# Patient Record
Sex: Female | Born: 1995 | Race: White | Hispanic: No | Smoking: Never smoker
Health system: Southern US, Community
[De-identification: ages and names within clinical notes are randomized; demographics above are authoritative.]

## PROBLEM LIST (undated history)

## (undated) DIAGNOSIS — Q5128 Other doubling of uterus, other specified: Secondary | ICD-10-CM

## (undated) DIAGNOSIS — J45909 Unspecified asthma, uncomplicated: Secondary | ICD-10-CM

## (undated) DIAGNOSIS — O34599 Maternal care for other abnormalities of gravid uterus, unspecified trimester: Secondary | ICD-10-CM

## (undated) HISTORY — PX: NO PAST SURGERIES: SHX2092

## (undated) HISTORY — PX: VAGINAL SEPTOPLASTY: SHX5390

---
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
## 2017-09-09 ENCOUNTER — Other Ambulatory Visit (HOSPITAL_COMMUNITY): Payer: Self-pay | Admitting: Obstetrics and Gynecology

## 2017-09-09 DIAGNOSIS — Z3689 Encounter for other specified antenatal screening: Secondary | ICD-10-CM

## 2017-09-09 DIAGNOSIS — Z3A21 21 weeks gestation of pregnancy: Secondary | ICD-10-CM

## 2017-09-09 DIAGNOSIS — Q6689 Other  specified congenital deformities of feet: Secondary | ICD-10-CM

## 2017-09-09 DIAGNOSIS — Q6602 Congenital talipes equinovarus, left foot: Secondary | ICD-10-CM

## 2017-09-16 ENCOUNTER — Encounter (HOSPITAL_COMMUNITY): Payer: Self-pay | Admitting: *Deleted

## 2017-09-17 ENCOUNTER — Other Ambulatory Visit (HOSPITAL_COMMUNITY): Payer: Self-pay | Admitting: Obstetrics and Gynecology

## 2017-09-17 ENCOUNTER — Encounter (HOSPITAL_COMMUNITY): Payer: Self-pay

## 2017-09-17 ENCOUNTER — Ambulatory Visit (HOSPITAL_COMMUNITY)
Admission: RE | Admit: 2017-09-17 | Discharge: 2017-09-17 | Disposition: A | Discharge status: Home / Self Care | Payer: Medicaid Other | Source: Ambulatory Visit | Attending: Obstetrics and Gynecology | Admitting: Obstetrics and Gynecology

## 2017-09-17 DIAGNOSIS — Z363 Encounter for antenatal screening for malformations: Secondary | ICD-10-CM | POA: Diagnosis present

## 2017-09-17 DIAGNOSIS — Z3A20 20 weeks gestation of pregnancy: Secondary | ICD-10-CM | POA: Insufficient documentation

## 2017-09-17 DIAGNOSIS — Z3A21 21 weeks gestation of pregnancy: Secondary | ICD-10-CM

## 2017-09-17 DIAGNOSIS — Z3689 Encounter for other specified antenatal screening: Secondary | ICD-10-CM

## 2017-09-17 DIAGNOSIS — Q6689 Other  specified congenital deformities of feet: Secondary | ICD-10-CM | POA: Diagnosis not present

## 2017-09-17 DIAGNOSIS — O34592 Maternal care for other abnormalities of gravid uterus, second trimester: Secondary | ICD-10-CM | POA: Insufficient documentation

## 2017-09-17 DIAGNOSIS — O358XX0 Maternal care for other (suspected) fetal abnormality and damage, not applicable or unspecified: Secondary | ICD-10-CM | POA: Insufficient documentation

## 2017-09-17 DIAGNOSIS — O359XX0 Maternal care for (suspected) fetal abnormality and damage, unspecified, not applicable or unspecified: Secondary | ICD-10-CM

## 2017-09-17 DIAGNOSIS — Q6602 Congenital talipes equinovarus, left foot: Secondary | ICD-10-CM

## 2017-09-17 HISTORY — DX: Unspecified asthma, uncomplicated: J45.909

## 2017-09-17 HISTORY — DX: Maternal care for other abnormalities of gravid uterus, unspecified trimester: O34.599

## 2017-09-17 HISTORY — DX: Other and unspecified doubling of uterus: Q51.28

## 2017-09-17 NOTE — Consult Note (Signed)
Maternal Fetal Medicine Consultation  I had the pleasure of seeing your patient Anne Lee for Maternal-Fetal Medicine consultation on 09/17/2017. As you know, Anne Lee is a 22 y.o. 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 Pregnancy was noted to be in the right horn.  Pelvic exam on 4/31/19 revealed "two vaginas" and plan for cesarean delivery was discussed. A first trimester screen was low risk for aneuploidy. At the time of anatomy ultrasound on 09/09/17 a left club foot was seen. The anatomic survey was incomplete but no other anomalies were noted.   Anne Lee feels well today. She denies contractions, loss of fluid, or vaginal bleeding. Fetal movement is active.   Prior to her visit today Anne Lee had an obstetric ultrasound that showed a female fetus with a left club foot. The remainder of the detailed anatomic survey was complete with no other anomalies noted. The pregnancy was seen in the right uterine horn. See separate ultrasound report for full detals.  The remainder of Anne Lee's medical history is notable for asthma. Her past surgical history is significant for no surgeries. She takes prenatal vitamins and is allergic to no medications. Anne Lee denies alcohol, tobacco or other drug use. Her family history is noncontributory.  We discussed the following issues during her visit today:  1. Isolated unilateral club foot: We discussed that isolated club foot is a common congenital abnormality that is generally sporadic. Corrective treatment has excellent outcomes. Anne Lee was referred for pediatric orthopedic consultation in the third trimester.   2. Uterine didelphys: Uterine anomalies occur in about 0.5-5.5% of women and are typically sporadic abnormalities in the normal fusion, elongation, canalization or resorption process that results in the formation of the  uterus, cervix and kidneys. Uterine didelphys results from a defect of lateral fusion of the two Mullerian ducts resulting in two uterine horns and often a duplicated cervix. Anne Lee was counseled that although one of the more dramatic appearing of the Mullerian anomalies, uterine didelphys is associated with reasonable pregnancy outcomes. Several pregnancy complications are more common however, including miscarriage (25-69%), preterm birth (17-53%), fetal growth restriction (11%), preeclampsia (13%), stillbirth (up to 5%), malpresentation (23-38%), labor dystocia and postpartum hemorrhage. Cesarean delivery rates are particularly high in uterine didelphys (up to 80% in some studies). Cesarean delivery is often more complicated because of distorted uterine anatomy, with a greater incidence of needing a classical hysterotomy. Women with uterine anomalies are also at higher risk for renal anomalies. Therefore, I recommend obtaining a one-time renal ultrasound in all women with uterine anomalies. Anne Lee understands that there are no interventions to prevent these complications and thus management typically involves clinical vigilance and surveillance strategies. I recommend serial growth ultrasound assessments starting at 28 weeks.  Thank you for the opportunity to be a part of the care of Anne Lee. Please contact our office if we can be of further assistance.   I spent approximately 40 minutes with this patient with over 50% of time spent in face-to-face counseling.  Darlyn ReadEmily Ramia Sidney, MD Maternal-Fetal Medicine

## 2018-04-28 ENCOUNTER — Encounter (HOSPITAL_COMMUNITY): Payer: Self-pay

## 2018-08-19 ENCOUNTER — Telehealth: Payer: Self-pay | Admitting: *Deleted

## 2018-08-19 NOTE — Telephone Encounter (Signed)
Pt aware to be looking for our phone call Monday. Will call as close to appt time as possible. Pt voiced understanding. JSY

## 2018-08-22 ENCOUNTER — Encounter: Payer: Self-pay | Admitting: Adult Health

## 2018-08-24 ENCOUNTER — Other Ambulatory Visit: Payer: Self-pay

## 2018-08-24 ENCOUNTER — Encounter: Payer: Self-pay | Admitting: Adult Health

## 2019-03-01 ENCOUNTER — Encounter: Payer: Self-pay | Admitting: Adult Health

## 2019-04-14 HISTORY — PX: DILATION AND CURETTAGE OF UTERUS: SHX78

## 2019-04-26 ENCOUNTER — Telehealth: Payer: Self-pay

## 2019-04-26 NOTE — Telephone Encounter (Addendum)
VM left on nurse line asking if our office is accepting new patients.   Called pt. Pt states she is [redacted] weeks pregnant and would like to begin care at our office. Pt states she had one prenatal appt around 8 weeks at an OBGYN near to [facility]; she believes it is a Development worker, international aid. Pt states she began bleeding yesterday morning like a period. She reports the bleeding stopped and began again this AM. Pt endorses continued bleeding like a period and pain in her abdomen.   I explained to the pt that it is very important for her to go to the ED at [facility] for evaluation. Explained that we are accepting new patients and she should call us back after she has had further evaluation and her acute symptoms have stabilized. Pt states she was told she had bleeding behind her cervix earlier in pregnancy so she is not sure this is abnormal. Educated pt that bleeding like a period is not a normal symptom of pregnancy and she should be evaluated immediately. Pt expresses intent to go to the [facility] ED for further evaluation.

## 2020-02-28 IMAGING — US US MFM OB DETAIL+14 WK
1 series · 14 of 28 positions shown · non-contrast
Comparison: none

[Series 1: us mfm ob detail+14 wk · 83 acquisitions, 14 frames shown]
[im 4/83]
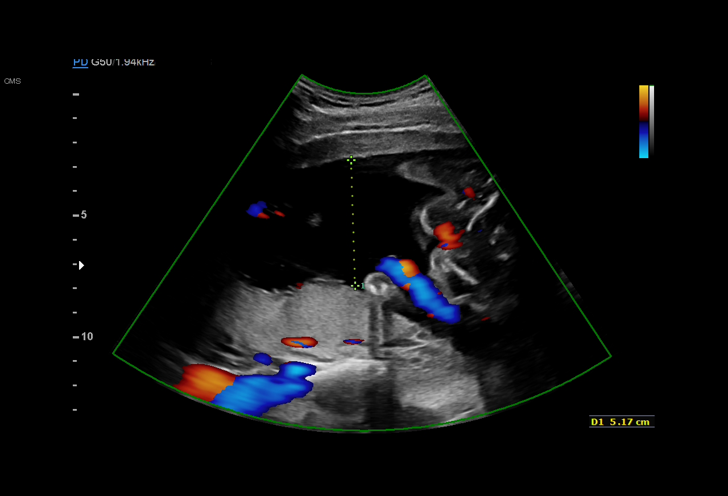
[im 10/83]
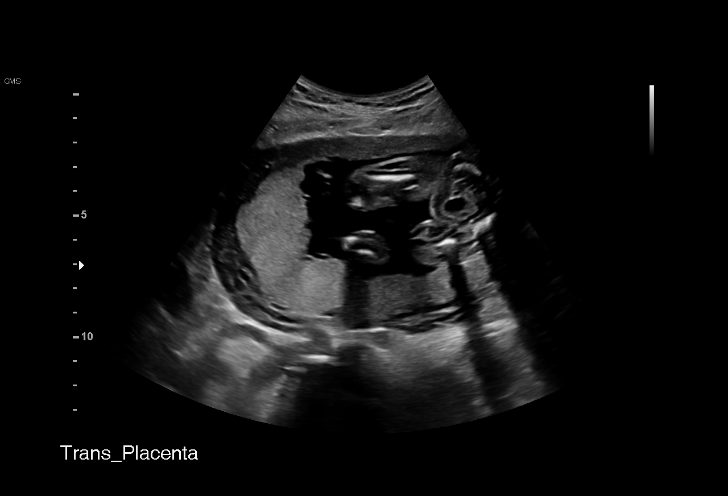
[im 16/83]
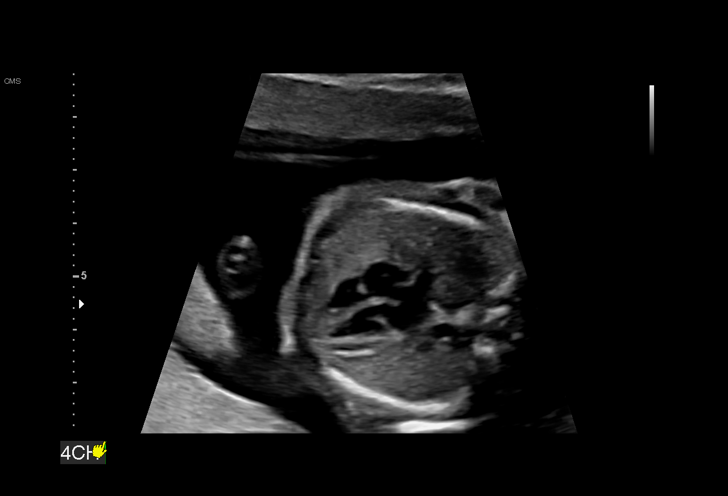
[im 22/83]
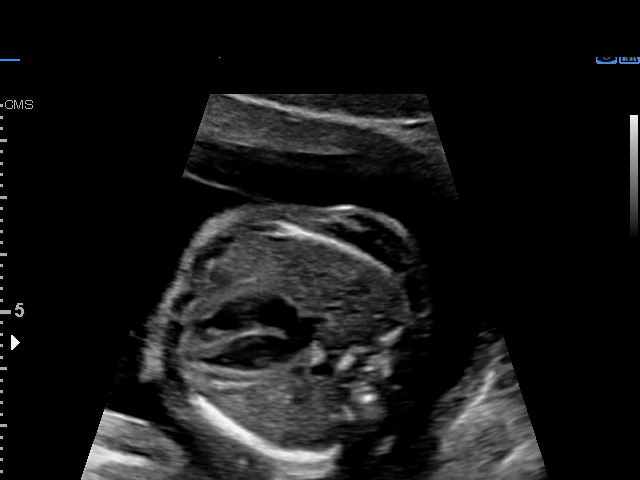
[im 28/83]
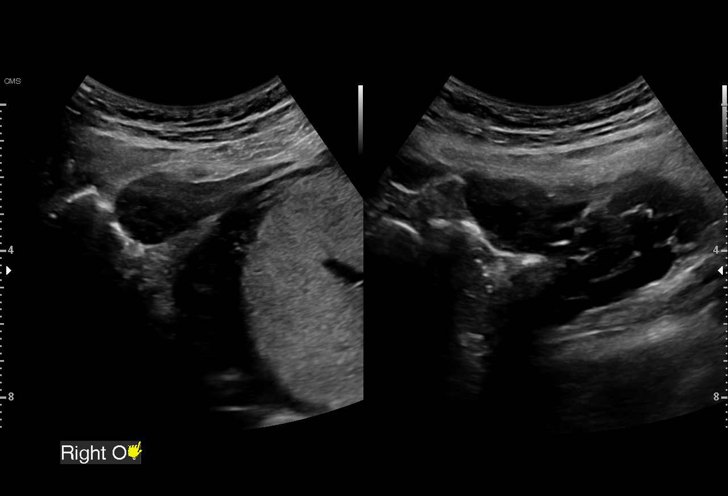
[im 34/83]
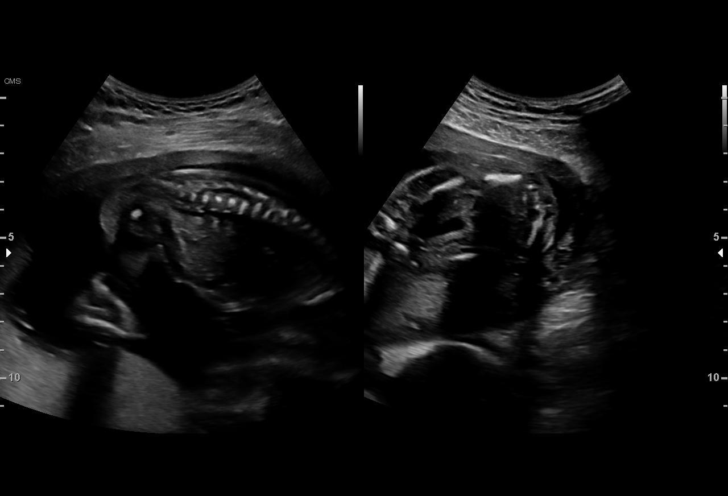
[im 40/83]
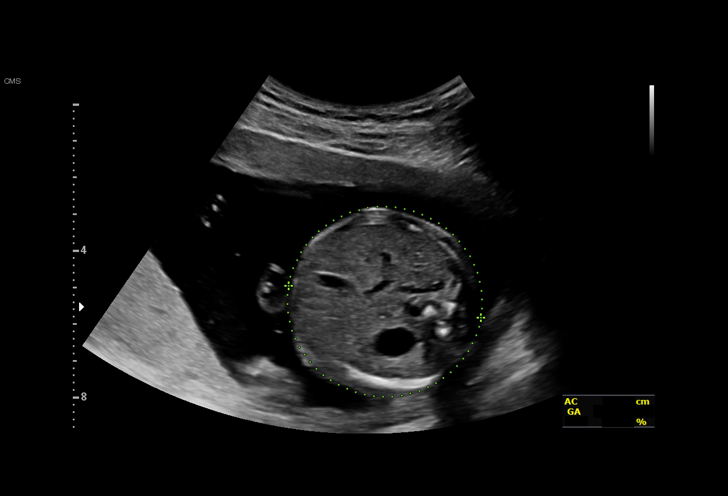
[im 46/83]
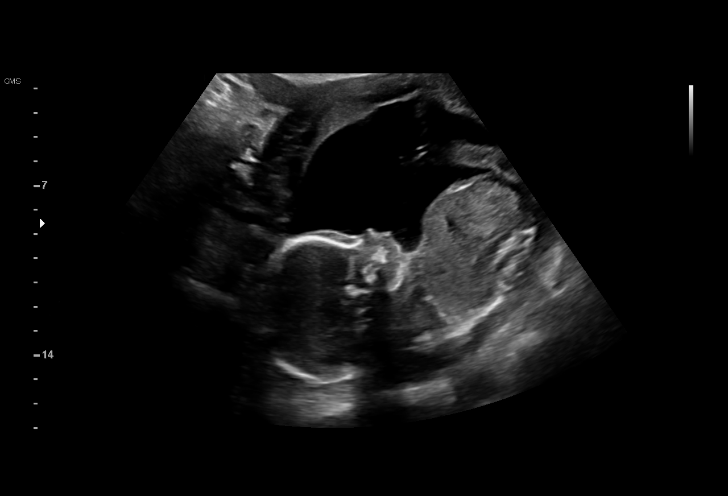
[im 52/83]
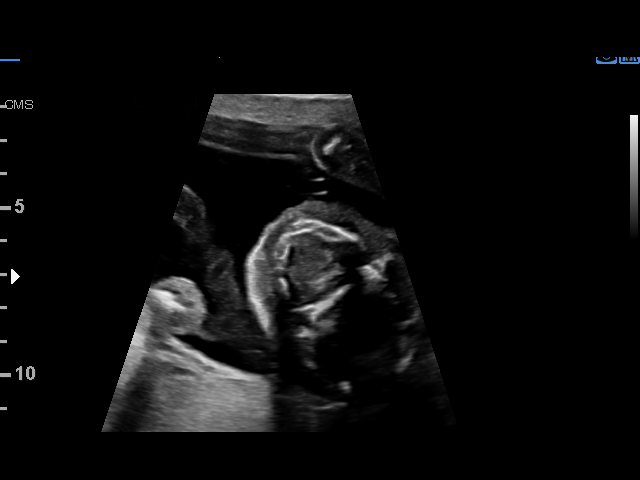
[im 58/83]
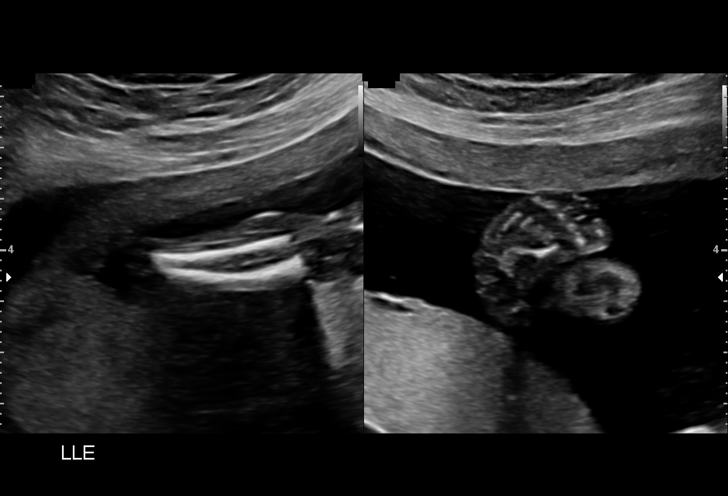
[im 64/83]
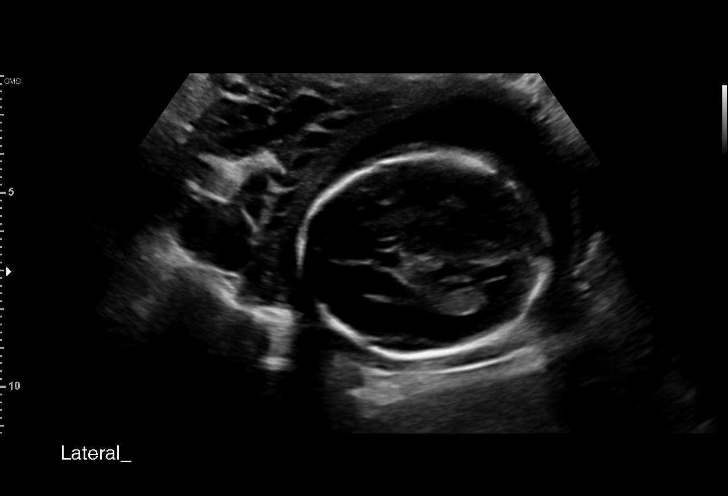
[im 70/83]
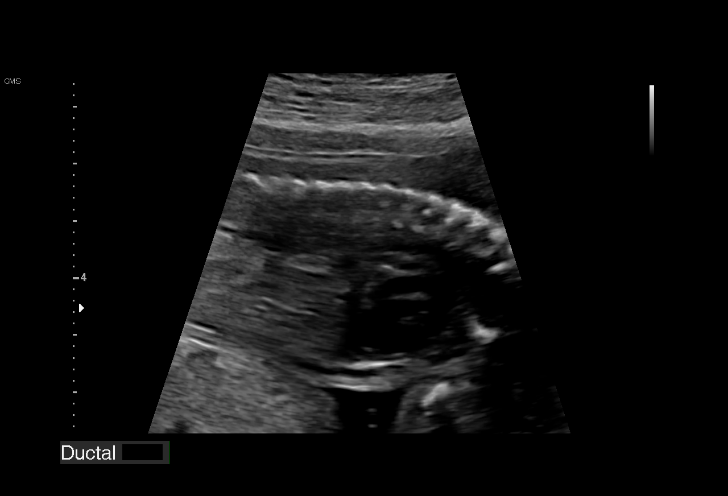
[im 76/83]
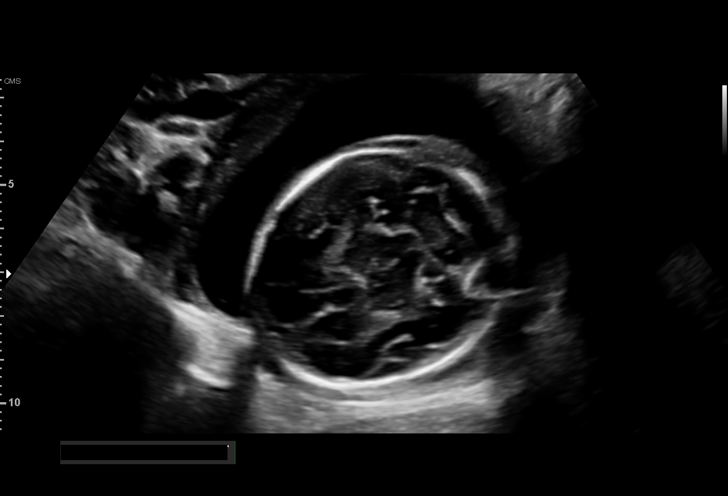
[im 83/83]
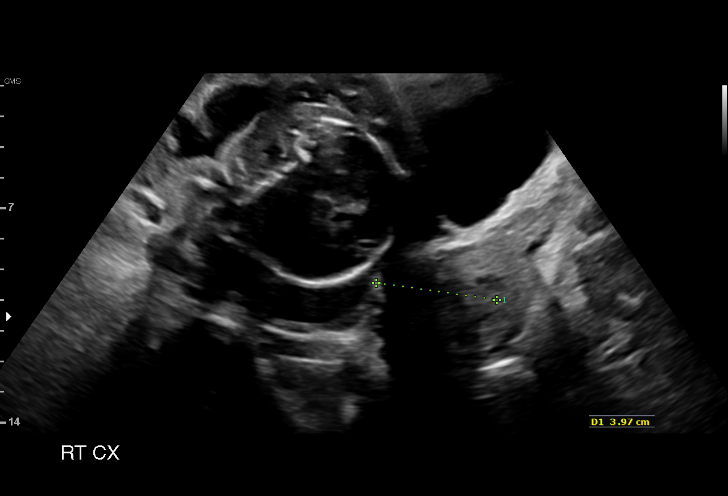

[14 of 28 positions shown; findings below may reference images not displayed]

OB/GYN [address]
[HOSPITAL], [place]
AUSBERT DO

1  MAHEEN CLIMACO         [id number]      [id number]     [id number]
Indications

20 weeks gestation of pregnancy
Encounter for antenatal screening for
malformations
Uterine abnormality during pregnancy
(didelphys)
Fetal abnormality - other known or
suspected (left club foot)
OB History

Gravidity:    1         Term:   0        Prem:   0        SAB:   0
TOP:          0       Ectopic:  0        Living: 0
Fetal Evaluation

Num Of Fetuses:     1
Fetal Heart         159
Rate(bpm):
Cardiac Activity:   Observed
Presentation:       Cephalic
Placenta:           Posterior, above cervical os
P. Cord Insertion:  Visualized

Amniotic Fluid
AFI FV:      Subjectively within normal limits

Largest Pocket(cm)
5.17
Biometry
BPD:      50.9  mm     G. Age:  21w 3d         91  %    CI:        73.06   %    70 - 86
FL/HC:      15.8   %    16.8 -
HC:      189.3  mm     G. Age:  21w 2d         85  %    HC/AC:      1.16        1.09 -
AC:      163.3  mm     G. Age:  21w 3d         81  %    FL/BPD:     58.9   %
FL:         30  mm     G. Age:  19w 2d         16  %    FL/AC:      18.4   %    20 - 24
HUM:      30.3  mm     G. Age:  20w 0d         49  %

Est. FW:     360  gm    0 lb 13 oz      54  %
Gestational Age

LMP:           20w 1d        Date:  04/29/17                 EDD:   02/03/18
U/S Today:     20w 6d                                        EDD:   01/29/18
Best:          20w 1d     Det. By:  LMP  (04/29/17)          EDD:   02/03/18
Anatomy

Cranium:               Appears normal         Aortic Arch:            Appears normal
Cavum:                 Appears normal         Ductal Arch:            Appears normal
Ventricles:            Appears normal         Diaphragm:              Appears normal
Choroid Plexus:        Appears normal         Stomach:                Appears normal, left
sided
Cerebellum:            Appears normal         Abdomen:                Appears normal
Posterior Fossa:       Appears normal         Abdominal Wall:         Appears nml (cord
insert, abd wall)
Nuchal Fold:           Appears normal         Cord Vessels:           Appears normal (3
vessel cord)
Face:                  Appears normal         Kidneys:                Appear normal
(orbits and profile)
Lips:                  Appears normal         Bladder:                Appears normal
Thoracic:              Appears normal         Spine:                  Appears normal
Heart:                 Appears normal         Upper Extremities:      Appears normal
(4CH, axis, and
situs)
RVOT:                  Appears normal         Lower Extremities:      Left Clubfoot
LVOT:                  Appears normal

Other:  Fetus appears to be a male. Nasal bone visualized. Technically
difficult due to fetal position.
Cervix Uterus Adnexa

Cervix
Length:              4  cm.
Normal appearance by transabdominal scan.

Uterus
Uterus didelphys.

Left Ovary
Not visualized.

Right Ovary
Within normal limits.

Cul De Sac:   No free fluid seen.

Adnexa:       No abnormality visualized.
Impression

Single living intrauterine pregnancy at 20w 1d.
Known uterus didelplhys, pregnancy in right horn.
Cephalic presentation.
Placenta Posterior, above cervical os.
Appropriate fetal growth.
Normal amniotic fluid volume.
The fetal anatomic survey is complete.
Left club foot.
Otherwise normal fetal anatomy.
No additional fetal anomalies or soft markers of aneuploidy
seen.
The adnexa appear normal bilaterally without masses.
The cervix measures 4cm on transabdominal imaging without
funneling.
Recommendations

See MFM consult.

## 2020-10-04 ENCOUNTER — Encounter: Payer: Self-pay | Admitting: Obstetrics & Gynecology

## 2020-10-04 ENCOUNTER — Ambulatory Visit (INDEPENDENT_AMBULATORY_CARE_PROVIDER_SITE_OTHER): Payer: Medicaid Other | Admitting: Obstetrics & Gynecology

## 2020-10-04 ENCOUNTER — Other Ambulatory Visit: Payer: Self-pay

## 2020-10-04 VITALS — BP 115/75 | HR 70 | Ht 62.0 in | Wt 118.4 lb

## 2020-10-04 DIAGNOSIS — Q5128 Other doubling of uterus, other specified: Secondary | ICD-10-CM | POA: Diagnosis not present

## 2020-10-04 DIAGNOSIS — N898 Other specified noninflammatory disorders of vagina: Secondary | ICD-10-CM

## 2020-10-04 NOTE — Progress Notes (Signed)
   GYN VISIT Patient name: Anne Lee MRN [id number]  Date of birth: [birth year] Chief Complaint:   Gynecologic Exam  History of Present Illness:   Anne Lee is a 25 y.o. [phone] female being seen today for the following concerns  Vaginal irritation: 3/22- s/p vaginal septoplasty.  [place] up [place]  She is concerned that a stitch is still present.  Denies discharge, itching or irritation.  Sexually active, previously painful. She does note some irregular spotting- Nexplanon placed about 4 mos ago  Contraception: Nexplanon placed 2022  Depression screen PHQ 2/9 10/04/2020  Decreased Interest 1  Down, Depressed, Hopeless 1  PHQ - 2 Score 2  Altered sleeping 1  Tired, decreased energy 1  Change in appetite 0  Feeling bad or failure about yourself  0  Trouble concentrating 0  Moving slowly or fidgety/restless 1  Suicidal thoughts 0  PHQ-9 Score 5     Review of Systems:   Pertinent items are noted in HPI Denies fever/chills, dizziness, headaches, visual disturbances, fatigue, shortness of breath, chest pain, abdominal pain, vomiting, bowel movements, urination, or intercourse unless otherwise stated above.  Pertinent History Reviewed:  Reviewed past medical,surgical, social, obstetrical and family history.  Reviewed problem list, medications and allergies. Physical Assessment:   Vitals:   10/04/20 1205  BP: 115/75  Pulse: 70  Weight: 118 lb 6.4 oz (53.7 kg)  Height: 5\' 2"  (1.575 m)  Body mass index is 21.66 kg/m.       Physical Examination:   General appearance: alert, well appearing, and in no distress  Psych: mood appropriate, normal affect  Skin: warm & dry   Cardiovascular: normal heart rate noted  Respiratory: normal respiratory effort, no distress  Abdomen: soft, non-tender   Pelvic: normal external genitalia, vulva, vagina- no abnormalities appreciated, no suture palpated, cervices x2  Extremities: no edema   Chaperone:    Assessment & Plan:  1) Vaginal concerns -healing appropriately from surgery, normal appearing vaginal canal, no suture appreciated  2) Contraceptive management -continue Nexplanon, reassured pt that irregular bleeding is common side effect  []  plan to obtain records, if pap not up to date, will complete at next visit   No orders of the defined types were placed in this encounter.   No follow-ups on file.   Faith Rogue, DO Attending Obstetrician & Gynecologist, [facility] for Myna Hidalgo, [facility]

## 2020-10-08 ENCOUNTER — Encounter: Payer: Self-pay | Admitting: Nurse Practitioner

## 2020-10-08 ENCOUNTER — Ambulatory Visit (INDEPENDENT_AMBULATORY_CARE_PROVIDER_SITE_OTHER): Payer: Medicaid Other | Admitting: Nurse Practitioner

## 2020-10-08 ENCOUNTER — Other Ambulatory Visit: Payer: Self-pay

## 2020-10-08 VITALS — BP 102/67 | HR 86 | Temp 97.9°F | Ht 62.0 in | Wt 119.0 lb

## 2020-10-08 DIAGNOSIS — F418 Other specified anxiety disorders: Secondary | ICD-10-CM | POA: Diagnosis not present

## 2020-10-08 MED ORDER — ESCITALOPRAM OXALATE 5 MG PO TABS: 5.0000 mg | ORAL_TABLET | Freq: Every day | ORAL | 0 refills | Status: DC

## 2020-10-08 NOTE — Progress Notes (Signed)
Denies suspicion for  New Patient Note  RE: Anne Lee MRN: [id number] DOB: 06/19/1995 Date of Office Visit: 10/08/2020  Chief Complaint: New Patient (Initial Visit), Anxiety, and Depression  History of Present Illness:   Depression: Patient complains of depression. She complains of depressed mood and difficulty concentrating. Onset was approximately a few months ago, unchanged since that time.  She denies current suicidal and homicidal plan or intent.   Family history significant for no psychiatric illness.Possible organic causes contributing are: none.  Risk factors: negative life event multiple deaths in the family in the past year, and a loss of a child. (Grief) Previous treatment includes  nothing  and none. She complains of the following side effects from the treatment: none.   Flowsheet Row Office Visit from 10/08/2020 in [facility]  PHQ-9 Total Score 11      Anxiety: Patient complains of anxiety disorder.  She has the following symptoms: difficulty concentrating, irritable. Onset of symptoms was approximately a few months ago, unchanged since that time. She denies current suicidal and homicidal ideation. Family history significant for no psychiatric illness.Possible organic causes contributing are: none. Risk factors: negative life event death in family.  Previous treatment includes  nothing  and none.  She complains of the following side effects from the treatment: none.   GAD 7 : Generalized Anxiety Score 10/08/2020 10/04/2020  Nervous, Anxious, on Edge 1 1  Control/stop worrying 1 1  Worry too much - different things 1 1  Trouble relaxing 1 1  Restless 1 1  Easily annoyed or irritable 1 1  Afraid - awful might happen 1 1  Total GAD 7 Score 7 7  Anxiety Difficulty Somewhat difficult -     Assessment and Plan: Lei is a 25 y.o. female with: Anxiety associated with depression Provided education to patient printed handouts given on managing depression  and anxiety.  This is new for patient in the last few months.  Symptoms associated with multiple deaths in the family.  Grief counseling resources provided to patient.  Started patient on Lexapro 5 mg tablet by mouth daily.  Follow-up in 6 weeks.  Rx sent to pharmacy.  Return in about 6 weeks (around 11/19/2020).   Diagnostics:   Past Medical History: Patient Active Problem List   Diagnosis Date Noted   Anxiety associated with depression 10/08/2020   Past Medical History:  Diagnosis Date   Asthma    Uterus didelphys in pregnancy    Past Surgical History: Past Surgical History:  Procedure Laterality Date   CESAREAN SECTION     VAGINAL SEPTOPLASTY     Medication List:  Current Outpatient Medications  Medication Sig Dispense Refill   escitalopram (LEXAPRO) 5 MG tablet Take 1 tablet (5 mg total) by mouth daily. 60 tablet 0   etonogestrel (NEXPLANON) 68 MG IMPL implant 1 each by Subdermal route once.     No current facility-administered medications for this visit.   Allergies: No Known Allergies Social History: Social History   Socioeconomic History   Marital status: Single    Spouse name: Not on file   Number of children: Not on file   Years of education: Not on file   Highest education level: Not on file  Occupational History   Not on file  Tobacco Use   Smoking status: Never   Smokeless tobacco: Never  Vaping Use   Vaping Use: Never used  Substance and Sexual Activity   Alcohol use: Never   Drug use:  Never   Sexual activity: Yes    Birth control/protection: Implant  Other Topics Concern   Not on file  Social History Narrative   Not on file   Social Determinants of Health   Financial Resource Strain: Not on file  Food Insecurity: Not on file  Transportation Needs: Not on file  Physical Activity: Not on file  Stress: Not on file  Social Connections: Not on file       Family History: Family History  Problem Relation Age of Onset   Depression  Mother    Anxiety disorder Mother    Kidney disease Mother    Hypertension Mother    Diabetes Mother    Hypertension Father          Review of Systems  Constitutional: Negative.   HENT: Negative.    Eyes: Negative.   Respiratory: Negative.    Cardiovascular: Negative.   Gastrointestinal: Negative.   Musculoskeletal: Negative.   Skin:  Negative for rash.  Psychiatric/Behavioral:  The patient is nervous/anxious.   All other systems reviewed and are negative. Objective: BP 102/67   Pulse 86   Temp 97.9 F (36.6 C) (Temporal)   Ht 5\' 2"  (1.575 m)   Wt 119 lb (54 kg)   SpO2 99%   BMI 21.77 kg/m  Body mass index is 21.77 kg/m. Physical Exam Vitals and nursing note reviewed.  Constitutional:      Appearance: Normal appearance.  HENT:     Head: Normocephalic.     Nose: Nose normal.  Eyes:     Conjunctiva/sclera: Conjunctivae normal.  Cardiovascular:     Rate and Rhythm: Normal rate and regular rhythm.     Pulses: Normal pulses.     Heart sounds: Normal heart sounds.  Pulmonary:     Effort: Pulmonary effort is normal.     Breath sounds: Normal breath sounds.  Abdominal:     General: Bowel sounds are normal.  Skin:    Findings: No rash.  Neurological:     Mental Status: She is alert and oriented to person, place, and time.  Psychiatric:        Attention and Perception: Attention and perception normal.        Mood and Affect: Mood is anxious and depressed.        Speech: Speech normal.        Behavior: Behavior is cooperative.        Thought Content: Thought content does not include suicidal ideation.   The plan was reviewed with the patient/family, and all questions/concerned were addressed.  It was my pleasure to see [name] today and participate in her care. Please feel free to contact me with any questions or concerns.  Sincerely,  Aurther Loft NP [facility]

## 2020-10-08 NOTE — Assessment & Plan Note (Signed)
Provided education to patient printed handouts given on managing depression and anxiety.  This is new for patient in the last few months.  Symptoms associated with multiple deaths in the family.  Grief counseling resources provided to patient.  Started patient on Lexapro 5 mg tablet by mouth daily.  Follow-up in 6 weeks.  Rx sent to pharmacy.

## 2020-10-08 NOTE — Patient Instructions (Signed)
http://APA.org/depression-guideline"> https://clinicalkey.com"> http://point-of-care.elsevierperformancemanager.com/skills/"> http://point-of-care.elsevierperformancemanager.com">  Managing Depression, Adult Depression is a mental health condition that affects your thoughts, feelings, and actions. Being diagnosed with depression can bring you relief if you did not know why you have felt or behaved a certain way. It could also leave you feeling overwhelmed with uncertainty about your future. Preparing yourself tomanage your symptoms can help you feel more positive about your future. How to manage lifestyle changes Managing stress  Stress is your body's reaction to life changes and events, both good and bad. Stress can add to your feelings of depression. Learning to manage your stresscan help lessen your feelings of depression. Try some of the following approaches to reducing your stress (stress reduction techniques): Listen to music that you enjoy and that inspires you. Try using a meditation app or take a meditation class. Develop a practice that helps you connect with your spiritual self. Walk in nature, pray, or go to a place of worship. Do some deep breathing. To do this, inhale slowly through your nose. Pause at the top of your inhale for a few seconds and then exhale slowly, letting your muscles relax. Practice yoga to help relax and work your muscles. Choose a stress reduction technique that suits your lifestyle and personality. These techniques take time and practice to develop. Set aside 5-15 minutes a day to do them. Therapists can offer training in these techniques. Other things you can do to manage stress include: Keeping a stress diary. Knowing your limits and saying no when you think something is too much. Paying attention to how you react to certain situations. You may not be able to control everything, but you can change your reaction. Adding humor to your life by watching funny films  or TV shows. Making time for activities that you enjoy and that relax you.  Medicines Medicines, such as antidepressants, are often a part of treatment for depression. Talk with your pharmacist or health care provider about all the medicines, supplements, and herbal products that you take, their possible side effects, and what medicines and other products are safe to take together. Make sure to report any side effects you may have to your health care provider. Relationships Your health care provider may suggest family therapy, couples therapy, orindividual therapy as part of your treatment. How to recognize changes Everyone responds differently to treatment for depression. As you recover from depression, you may start to: Have more interest in doing activities. Feel less hopeless. Have more energy. Overeat less often, or have a better appetite. Have better mental focus. It is important to recognize if your depression is not getting better or is getting worse. The symptoms you had in the beginning may return, such as: Tiredness (fatigue) or low energy. Eating too much or too little. Sleeping too much or too little. Feeling restless, agitated, or hopeless. Trouble focusing or making decisions. Unexplained physical complaints. Feeling irritable, angry, or aggressive. If you or your family members notice these symptoms coming back, let yourhealth care provider know right away. Follow these instructions at home: Activity  Try to get some form of exercise each day, such as walking, biking, swimming, or lifting weights. Practice stress reduction techniques. Engage your mind by taking a class or doing some volunteer work.  Lifestyle Get the right amount and quality of sleep. Cut down on using caffeine, tobacco, alcohol, and other potentially harmful substances. Eat a healthy diet that includes plenty of vegetables, fruits, whole grains, low-fat dairy products, and lean protein. Do not   eat  a lot of foods that are high in solid fats, added sugars, or salt (sodium). General instructions Take over-the-counter and prescription medicines only as told by your health care provider. Keep all follow-up visits as told by your health care provider. This is important. Where to find support Talking to others  Friends and family members can be sources of support and guidance. Talk to trusted friends or family members about your condition. Explain your symptoms to them, and let them know that you are working with a health care provider to treat your depression. Tell friends and family members how they also can behelpful. Finances Find appropriate mental health providers that fit with your financial situation. Talk with your health care provider about options to get reduced prices on your medicines. Where to find more information You can find support in your area from: Anxiety and Depression Association of America (ADAA): www.adaa.org Mental Health America: www.mentalhealthamerica.net National Alliance on Mental Illness: www.nami.org Contact a health care provider if: You stop taking your antidepressant medicines, and you have any of these symptoms: Nausea. Headache. Light-headedness. Chills and body aches. Not being able to sleep (insomnia). You or your friends and family think your depression is getting worse. Get help right away if: You have thoughts of hurting yourself or others. If you ever feel like you may hurt yourself or others, or have thoughts about taking your own life, get help right away. Go to your nearest emergency department or: Call your local emergency services (911 in the [place] Call a suicide crisis helpline, such as the National Suicide Prevention Lifeline at 1-800-273-8255. This is open 24 hours a day in the [place] Text the Crisis Text Line at 741741 (in the [place] Summary If you are diagnosed with depression, preparing yourself to manage your symptoms is a good way  to feel positive about your future. Work with your health care provider on a management plan that includes stress reduction techniques, medicines (if applicable), therapy, and healthy lifestyle habits. Keep talking with your health care provider about how your treatment is working. If you have thoughts about taking your own life, call a suicide crisis helpline or text a crisis text line. This information is not intended to replace advice given to you by your health care provider. Make sure you discuss any questions you have with your healthcare provider. Document Revised: 02/08/2019 Document Reviewed: 02/08/2019 Elsevier Patient Education  2022 Elsevier Inc. http://NIMH.NIH.Gov">  Generalized Anxiety Disorder, Adult Generalized anxiety disorder (GAD) is a mental health condition. Unlike normal worries, anxiety related to GAD is not triggered by a specific event. These worries do not fade or get better with time. GAD interferes with relationships,work, and school. GAD symptoms can vary from mild to severe. People with severe GAD can have intense waves of anxiety with physical symptoms that are similar to panicattacks. What are the causes? The exact cause of GAD is not known, but the following are believed to have an impact: Differences in natural brain chemicals. Genes passed down from parents to children. Differences in the way threats are perceived. Development during childhood. Personality. What increases the risk? The following factors may make you more likely to develop this condition: Being female. Having a family history of anxiety disorders. Being very shy. Experiencing very stressful life events, such as the death of a loved one. Having a very stressful family environment. What are the signs or symptoms? People with GAD often worry excessively about many things in their lives, such as their health   and family. Symptoms may also include: Mental and emotional symptoms: Worrying  excessively about natural disasters. Fear of being late. Difficulty concentrating. Fears that others are judging your performance. Physical symptoms: Fatigue. Headaches, muscle tension, muscle twitches, trembling, or feeling shaky. Feeling like your heart is pounding or beating very fast. Feeling out of breath or like you cannot take a deep breath. Having trouble falling asleep or staying asleep, or experiencing restlessness. Sweating. Nausea, diarrhea, or irritable bowel syndrome (IBS). Behavioral symptoms: Experiencing erratic moods or irritability. Avoidance of new situations. Avoidance of people. Extreme difficulty making decisions. How is this diagnosed? This condition is diagnosed based on your symptoms and medical history. You will also have a physical exam. Your health care provider may perform tests torule out other possible causes of your symptoms. To be diagnosed with GAD, a person must have anxiety that: Is out of his or her control. Affects several different aspects of his or her life, such as work and relationships. Causes distress that makes him or her unable to take part in normal activities. Includes at least three symptoms of GAD, such as restlessness, fatigue, trouble concentrating, irritability, muscle tension, or sleep problems. Before your health care provider can confirm a diagnosis of GAD, these symptoms must be present more days than they are not, and they must last for 6 months orlonger. How is this treated? This condition may be treated with: Medicine. Antidepressant medicine is usually prescribed for long-term daily control. Anti-anxiety medicines may be added in severe cases, especially when panic attacks occur. Talk therapy (psychotherapy). Certain types of talk therapy can be helpful in treating GAD by providing support, education, and guidance. Options include: Cognitive behavioral therapy (CBT). People learn coping skills and self-calming techniques to  ease their physical symptoms. They learn to identify unrealistic thoughts and behaviors and to replace them with more appropriate thoughts and behaviors. Acceptance and commitment therapy (ACT). This treatment teaches people how to be mindful as a way to cope with unwanted thoughts and feelings. Biofeedback. This process trains you to manage your body's response (physiological response) through breathing techniques and relaxation methods. You will work with a therapist while machines are used to monitor your physical symptoms. Stress management techniques. These include yoga, meditation, and exercise. A mental health specialist can help determine which treatment is best for you. Some people see improvement with one type of therapy. However, other peoplerequire a combination of therapies. Follow these instructions at home: Lifestyle Maintain a consistent routine and schedule. Anticipate stressful situations. Create a plan, and allow extra time to work with your plan. Practice stress management or self-calming techniques that you have learned from your therapist or your health care provider. General instructions Take over-the-counter and prescription medicines only as told by your health care provider. Understand that you are likely to have setbacks. Accept this and be kind to yourself as you persist to take better care of yourself. Recognize and accept your accomplishments, even if you judge them as small. Keep all follow-up visits as told by your health care provider. This is important. Contact a health care provider if: Your symptoms do not get better. Your symptoms get worse. You have signs of depression, such as: A persistently sad or irritable mood. Loss of enjoyment in activities that used to bring you joy. Change in weight or eating. Changes in sleeping habits. Avoiding friends or family members. Loss of energy for normal tasks. Feelings of guilt or worthlessness. Get help right away  if: You have serious thoughts   about hurting yourself or others. If you ever feel like you may hurt yourself or others, or have thoughts about taking your own life, get help right away. Go to your nearest emergency department or: Call your local emergency services (911 in the [place] Call a suicide crisis helpline, such as the National Suicide Prevention Lifeline at 1-800-273-8255. This is open 24 hours a day in the [place] Text the Crisis Text Line at 741741 (in the [place] Summary Generalized anxiety disorder (GAD) is a mental health condition that involves worry that is not triggered by a specific event. People with GAD often worry excessively about many things in their lives, such as their health and family. GAD may cause symptoms such as restlessness, trouble concentrating, sleep problems, frequent sweating, nausea, diarrhea, headaches, and trembling or muscle twitching. A mental health specialist can help determine which treatment is best for you. Some people see improvement with one type of therapy. However, other people require a combination of therapies. This information is not intended to replace advice given to you by your health care provider. Make sure you discuss any questions you have with your healthcare provider. Document Revised: 01/18/2019 Document Reviewed: 01/18/2019 Elsevier Patient Education  2022 Elsevier Inc.  

## 2020-11-19 ENCOUNTER — Encounter: Payer: Self-pay | Admitting: Nurse Practitioner

## 2020-11-19 ENCOUNTER — Encounter: Payer: Medicaid Other | Admitting: Nurse Practitioner

## 2020-11-28 ENCOUNTER — Other Ambulatory Visit: Payer: Self-pay | Admitting: *Deleted

## 2020-11-28 DIAGNOSIS — F418 Other specified anxiety disorders: Secondary | ICD-10-CM

## 2020-11-28 MED ORDER — ESCITALOPRAM OXALATE 5 MG PO TABS: 5.0000 mg | ORAL_TABLET | Freq: Every day | ORAL | 0 refills | Status: DC

## 2021-01-08 ENCOUNTER — Ambulatory Visit (INDEPENDENT_AMBULATORY_CARE_PROVIDER_SITE_OTHER): Payer: Medicaid Other | Admitting: Advanced Practice Midwife

## 2021-01-08 ENCOUNTER — Other Ambulatory Visit (HOSPITAL_COMMUNITY)
Admission: RE | Admit: 2021-01-08 | Discharge: 2021-01-08 | Disposition: A | Discharge status: Home / Self Care | Payer: Medicaid Other | Source: Ambulatory Visit | Attending: Advanced Practice Midwife | Admitting: Advanced Practice Midwife

## 2021-01-08 ENCOUNTER — Encounter: Payer: Self-pay | Admitting: Advanced Practice Midwife

## 2021-01-08 ENCOUNTER — Other Ambulatory Visit: Payer: Self-pay

## 2021-01-08 VITALS — BP 106/71 | HR 86 | Ht 62.0 in | Wt 121.0 lb

## 2021-01-08 DIAGNOSIS — Z124 Encounter for screening for malignant neoplasm of cervix: Secondary | ICD-10-CM | POA: Insufficient documentation

## 2021-01-08 DIAGNOSIS — F419 Anxiety disorder, unspecified: Secondary | ICD-10-CM

## 2021-01-08 DIAGNOSIS — N939 Abnormal uterine and vaginal bleeding, unspecified: Secondary | ICD-10-CM

## 2021-01-08 DIAGNOSIS — Z975 Presence of (intrauterine) contraceptive device: Secondary | ICD-10-CM | POA: Diagnosis not present

## 2021-01-08 DIAGNOSIS — Z3202 Encounter for pregnancy test, result negative: Secondary | ICD-10-CM | POA: Diagnosis not present

## 2021-01-08 DIAGNOSIS — N921 Excessive and frequent menstruation with irregular cycle: Secondary | ICD-10-CM | POA: Diagnosis not present

## 2021-01-08 DIAGNOSIS — F32A Depression, unspecified: Secondary | ICD-10-CM

## 2021-01-08 LAB — POCT URINE PREGNANCY: Preg Test, Ur: NEGATIVE

## 2021-01-08 MED ORDER — MEGESTROL ACETATE 40 MG PO TABS: 40.0000 mg | ORAL_TABLET | Freq: Every day | ORAL | 3 refills | Status: DC

## 2021-01-08 NOTE — Addendum Note (Signed)
Addended by: Federico Flake A on: 01/08/2021 12:10 PM   Modules accepted: Orders

## 2021-01-08 NOTE — Progress Notes (Signed)
   GYN VISIT Patient name: Anne Lee MRN [id number]  Date of birth: February 06, 1996 Chief Complaint:   abnormal bleeding  History of Present Illness:   Anne Lee is a 25 y.o. G16P1011 Caucasian female being seen today for prolonged bleeding on Nexplanon (9/7-9/25).    Patient's last menstrual period was 12/18/2020. The current method of family planning is Nexplanon.  Last pap never.   Depression screen [facility] 2/9 10/08/2020 10/04/2020  Decreased Interest 1 1  Down, Depressed, Hopeless 1 1  PHQ - 2 Score 2 2  Altered sleeping 3 1  Tired, decreased energy 1 1  Change in appetite 1 0  Feeling bad or failure about yourself  1 0  Trouble concentrating 1 0  Moving slowly or fidgety/restless 1 1  Suicidal thoughts 1 0  PHQ-9 Score 11 5  Difficult doing work/chores Somewhat difficult -     GAD 7 : Generalized Anxiety Score 10/08/2020 10/04/2020  Nervous, Anxious, on Edge 1 1  Control/stop worrying 1 1  Worry too much - different things 1 1  Trouble relaxing 1 1  Restless 1 1  Easily annoyed or irritable 1 1  Afraid - awful might happen 1 1  Total GAD 7 Score 7 7  Anxiety Difficulty Somewhat difficult -     Review of Systems:   Pertinent items are noted in HPI Denies fever/chills, dizziness, headaches, visual disturbances, fatigue, shortness of breath, chest pain, abdominal pain, vomiting, abnormal vaginal discharge/itching/odor/irritation, problems with periods, bowel movements, urination, or intercourse unless otherwise stated above.  Pertinent History Reviewed:  Reviewed past medical,surgical, social, obstetrical and family history.  Reviewed problem list, medications and allergies. Physical Assessment:   Vitals:   01/08/21 1121  BP: 106/71  Pulse: 86  Weight: 121 lb (54.9 kg)  Height: 5\' 2"  (1.575 m)  Body mass index is 22.13 kg/m.       Physical Examination:   General appearance: alert, well appearing, and in no distress  Mental status: alert, oriented to person,  place, and time  Skin: warm & dry   Cardiovascular: normal heart rate noted  Respiratory: normal respiratory effort, no distress  Abdomen: soft, non-tender   Pelvic: normal external genitalia, vulva, vagina, cervix, uterus and adnexa; sm spotting at os; pap collected with GC/chlam  Extremities: no edema   Chaperone:    Results for orders placed or performed in visit on 01/08/21 (from the past 24 hour(s))  POCT urine pregnancy   Collection Time: 01/08/21 11:36 AM  Result Value Ref Range   Preg Test, Ur Negative Negative    Assessment & Plan:  1) Breakthrough bldg on Nex> discussed this is normal; offered Megace course; will start if has another bleeding episode  2) Anxiety/depression, sees 01/10/21 NP for Lexapro management    Meds:  Meds ordered this encounter  Medications   megestrol (MEGACE) 40 MG tablet    Sig: Take 1 tablet (40 mg total) by mouth daily. Take 3 per day x 5d; 2 per day x 5d, then 1 qd until bleeding stops    Dispense:  45 tablet    Refill:  3    Order Specific Question:   Supervising Provider    Answer:   Lynnell Chad [[id number]]    Orders Placed This Encounter  Procedures   POCT urine pregnancy    Return for cancel Oct pap/phys; can you set up w MyChart please.  Nov CNM 11:57 AM  01/08/21

## 2021-01-10 ENCOUNTER — Other Ambulatory Visit: Payer: Self-pay | Admitting: Advanced Practice Midwife

## 2021-01-10 ENCOUNTER — Encounter: Payer: Self-pay | Admitting: Advanced Practice Midwife

## 2021-01-10 DIAGNOSIS — A749 Chlamydial infection, unspecified: Secondary | ICD-10-CM | POA: Insufficient documentation

## 2021-01-10 LAB — CYTOLOGY - PAP
Chlamydia: POSITIVE — AB
Comment: NEGATIVE
Comment: NEGATIVE
Comment: NORMAL
Diagnosis: NEGATIVE
Diagnosis: REACTIVE
High risk HPV: NEGATIVE
Neisseria Gonorrhea: NEGATIVE

## 2021-01-10 MED ORDER — AZITHROMYCIN 250 MG PO TABS: ORAL_TABLET | ORAL | 0 refills | Status: DC

## 2021-01-10 MED ORDER — METRONIDAZOLE 500 MG PO TABS: 500.0000 mg | ORAL_TABLET | Freq: Two times a day (BID) | ORAL | 0 refills | Status: DC

## 2021-01-13 ENCOUNTER — Encounter: Payer: Self-pay | Admitting: *Deleted

## 2021-01-29 ENCOUNTER — Other Ambulatory Visit: Payer: Medicaid Other | Admitting: Obstetrics & Gynecology

## 2021-02-05 ENCOUNTER — Other Ambulatory Visit: Payer: Medicaid Other

## 2021-03-24 ENCOUNTER — Other Ambulatory Visit: Payer: Medicaid Other

## 2021-06-11 ENCOUNTER — Other Ambulatory Visit: Payer: Self-pay

## 2021-06-11 ENCOUNTER — Other Ambulatory Visit (HOSPITAL_COMMUNITY)
Admission: RE | Admit: 2021-06-11 | Discharge: 2021-06-11 | Disposition: A | Discharge status: Home / Self Care | Payer: Medicaid Other | Source: Ambulatory Visit | Attending: Women's Health | Admitting: Women's Health

## 2021-06-11 ENCOUNTER — Encounter: Payer: Self-pay | Admitting: Women's Health

## 2021-06-11 ENCOUNTER — Ambulatory Visit (INDEPENDENT_AMBULATORY_CARE_PROVIDER_SITE_OTHER): Payer: Medicaid Other | Admitting: Women's Health

## 2021-06-11 VITALS — BP 103/66 | HR 71 | Ht 62.0 in | Wt 120.0 lb

## 2021-06-11 DIAGNOSIS — N898 Other specified noninflammatory disorders of vagina: Secondary | ICD-10-CM

## 2021-06-11 DIAGNOSIS — Z30013 Encounter for initial prescription of injectable contraceptive: Secondary | ICD-10-CM | POA: Diagnosis not present

## 2021-06-11 DIAGNOSIS — N889 Noninflammatory disorder of cervix uteri, unspecified: Secondary | ICD-10-CM

## 2021-06-11 DIAGNOSIS — A749 Chlamydial infection, unspecified: Secondary | ICD-10-CM

## 2021-06-11 DIAGNOSIS — Z3046 Encounter for surveillance of implantable subdermal contraceptive: Secondary | ICD-10-CM

## 2021-06-11 DIAGNOSIS — Z3202 Encounter for pregnancy test, result negative: Secondary | ICD-10-CM

## 2021-06-11 LAB — POCT URINE PREGNANCY: Preg Test, Ur: NEGATIVE

## 2021-06-11 MED ORDER — MEDROXYPROGESTERONE ACETATE 150 MG/ML IM SUSP: 150.0000 mg | INTRAMUSCULAR | 3 refills | Status: DC

## 2021-06-11 MED ORDER — MEDROXYPROGESTERONE ACETATE 150 MG/ML IM SUSP
150.0000 mg | Freq: Once | INTRAMUSCULAR | Status: AC
  Administered 2021-06-11: 150 mg via INTRAMUSCULAR

## 2021-06-11 NOTE — Addendum Note (Signed)
Addended by: Colen Darling on: 06/11/2021 12:59 PM ? ? Modules accepted: Orders ? ?

## 2021-06-11 NOTE — Patient Instructions (Signed)
Medroxyprogesterone Injection (Contraception) °What is this medication? °MEDROXYPROGESTERONE (me DROX ee proe JES te rone) prevents ovulation and pregnancy. It belongs to a group of medications called contraceptives. This medication is a progestin hormone. °This medicine may be used for other purposes; ask your health care provider or pharmacist if you have questions. °COMMON BRAND NAME(S): Depo-Provera, Depo-subQ Provera 104 °What should I tell my care team before I take this medication? °They need to know if you have any of these conditions: °Asthma °Blood clots °Breast cancer or family history of breast cancer °Depression °Diabetes °Eating disorder (anorexia nervosa) °Heart attack °High blood pressure °HIV infection or AIDS °If you often drink alcohol °Kidney disease °Liver disease °Migraine headaches °Osteoporosis, weak bones °Seizures °Stroke °Tobacco smoker °Vaginal bleeding °An unusual or allergic reaction to medroxyprogesterone, other hormones, medications, foods, dyes, or preservatives °Pregnant or trying to get pregnant °Breast-feeding °How should I use this medication? °Depo-Provera CI contraceptive injection is given into a muscle. Depo-subQ Provera 104 injection is given under the skin. It is given in a hospital or clinic setting. The injection is usually given during the first 5 days after the start of a menstrual period or 6 weeks after delivery of a baby. °A patient package insert for the product will be given with each prescription and refill. Be sure to read this information carefully each time. The sheet may change often. °Talk to your care team about the use of this medication in children. Special care may be needed. These injections have been used in female children who have started having menstrual periods. °Overdosage: If you think you have taken too much of this medicine contact a poison control center or emergency room at once. °NOTE: This medicine is only for you. Do not share this medicine  with others. °What if I miss a dose? °Keep appointments for follow-up doses. You must get an injection once every 3 months. It is important not to miss your dose. Call your care team if you are unable to keep an appointment. °What may interact with this medication? °Antibiotics or medications for infections, especially rifampin and griseofulvin °Antivirals for HIV or hepatitis °Aprepitant °Armodafinil °Bexarotene °Bosentan °Medications for seizures like carbamazepine, felbamate, oxcarbazepine, phenytoin, phenobarbital, primidone, topiramate °Mitotane °Modafinil °St. John's wort °This list may not describe all possible interactions. Give your health care provider a list of all the medicines, herbs, non-prescription drugs, or dietary supplements you use. Also tell them if you smoke, drink alcohol, or use illegal drugs. Some items may interact with your medicine. °What should I watch for while using this medication? °This medication does not protect you against HIV infection (AIDS) or other sexually transmitted diseases. °Use of this product may cause you to lose calcium from your bones. Loss of calcium may cause weak bones (osteoporosis). Only use this product for more than 2 years if other forms of birth control are not right for you. The longer you use this product for birth control the more likely you will be at risk for weak bones. Ask your care team how you can keep strong bones. °You may have a change in bleeding pattern or irregular periods. Many females stop having periods while taking this medication. °If you have received your injections on time, your chance of being pregnant is very low. If you think you may be pregnant, see your care team as soon as possible. °Tell your care team if you want to get pregnant within the next year. The effect of this medication may last a   long time after you get your last injection. °What side effects may I notice from receiving this medication? °Side effects that you should  report to your care team as soon as possible: °Allergic reactions--skin rash, itching, hives, swelling of the face, lips, tongue, or throat °Blood clot--pain, swelling, or warmth in the leg, shortness of breath, chest pain °Gallbladder problems--severe stomach pain, nausea, vomiting, fever °Increase in blood pressure °Liver injury--right upper belly pain, loss of appetite, nausea, light-colored stool, dark yellow or brown urine, yellowing skin or eyes, unusual weakness or fatigue °New or worsening migraines or headaches °Seizures °Stroke--sudden numbness or weakness of the face, arm, or leg, trouble speaking, confusion, trouble walking, loss of balance or coordination, dizziness, severe headache, change in vision °Unusual vaginal discharge, itching, or odor °Worsening mood, feelings of depression °Side effects that usually do not require medical attention (report to your care team if they continue or are bothersome): °Breast pain or tenderness °Dark patches of the skin on the face or other sun-exposed areas °Irregular menstrual cycles or spotting °Nausea °Weight gain °This list may not describe all possible side effects. Call your doctor for medical advice about side effects. You may report side effects to FDA at 1-800-FDA-1088. °Where should I keep my medication? °This injection is only given by a care team. It will not be stored at home. °NOTE: This sheet is a summary. It may not cover all possible information. If you have questions about this medicine, talk to your doctor, pharmacist, or health care provider. °© 2022 Elsevier/Gold Standard (2020-06-02 00:00:00) ° °

## 2021-06-11 NOTE — Progress Notes (Signed)
? ?NEXPLANON REMOVAL ?Patient name: Anne Anne MRN [id number]  Date of birth: 04-28-1995 ?Subjective Findings:   ?LEEBA Anne is a 26 y.o. G73P1011 Caucasian female being seen today for removal of a Nexplanon. Her Nexplanon was placed 2022.  She desires removal because of irregular bleeding despite megace. Signed copy of informed consent in chart. Reports vaginal odor, no itching, irritation. ? ?No LMP recorded. Patient has had an implant. ?Last pap 01/08/21. Results were: NILM w/ HRHPV negative ?The planned method of family planning is IUD [place] ? ?Depression screen [facility] 2/9 10/08/2020 10/04/2020  ?Decreased Interest 1 1  ?Down, Depressed, Hopeless 1 1  ?PHQ - 2 Score 2 2  ?Altered sleeping 3 1  ?Tired, decreased energy 1 1  ?Change in appetite 1 0  ?Feeling bad or failure about yourself  1 0  ?Trouble concentrating 1 0  ?Moving slowly or fidgety/restless 1 1  ?Suicidal thoughts 1 0  ?PHQ-9 Score 11 5  ?Difficult doing work/chores Somewhat difficult -  ? ?  ?GAD 7 : Generalized Anxiety Score 10/08/2020 10/04/2020  ?Nervous, Anxious, on Edge 1 1  ?Control/stop worrying 1 1  ?Worry too much - different things 1 1  ?Trouble relaxing 1 1  ?Restless 1 1  ?Easily annoyed or irritable 1 1  ?Afraid - awful might happen 1 1  ?Total GAD 7 Score 7 7  ?Anxiety Difficulty Somewhat difficult -  ? ? ? ?Pertinent History Reviewed:   ?Reviewed past medical,surgical, social, obstetrical and family history.  ?Reviewed problem list, medications and allergies. ?Objective Findings & Procedure:   ? ?Vitals:  ? 06/11/21 1146  ?BP: 103/66  ?Pulse: 71  ?Weight: 120 lb (54.4 kg)  ?Height: 5\' 2"  (1.575 m)  ?Body mass index is 21.95 kg/m?. ? ?Results for orders placed or performed in visit on 06/11/21 (from the past 24 hour(s))  ?POCT urine pregnancy  ? Collection Time: 06/11/21 11:59 AM  ?Result Value Ref Range  ? Preg Test, Ur Negative Negative  ?  ? ?Time out was performed. ? ?Nexplanon site identified.  Area prepped in usual sterile  fashon. One cc of 2% lidocaine was used to anesthetize the area at the distal end of the implant. A small stab incision was made right beside the implant on the distal portion.  The Nexplanon rod was grasped using hemostats and removed without difficulty.  There was less than 3 cc blood loss. There were no complications.  Steri-strips were applied over the small incision and a pressure bandage was applied.  The patient tolerated the procedure well. ? ?IUD INSERTION (not inserted) ?The risks and benefits of the method and placement have been thouroughly reviewed with the patient and all questions were answered.  Specifically the patient is aware of failure rate of 04/998, expulsion of the IUD and of possible perforation.  The patient is aware of irregular bleeding due to the method and understands the incidence of irregular bleeding diminishes with time.  Signed copy of informed consent in chart.  ? ?Time out was performed. ? ?A graves speculum was placed in the vagina.  2 cervices identified. Pt states she has been told this in the past, and that they cut out a septum. Uterine anatomy uncertain. Is not a candidate for IUD. So discussed options and wants depo ?Chaperone: Levy Pupa ? ? ? ?Assessment & Plan:   ?1) Nexplanon removal ?She was instructed to keep the area clean and dry, remove pressure bandage in 24 hours, and keep insertion  site covered with the steri-strip for 3-5 days.   ?Follow-up PRN problems. ? ?2) Contraception management> depo today, then q 56mths, rx sent, condoms x 2wks ? ?3) 2 cervices and unknown uterine anatomy> so was not a candidate for IUD insertion ? ?4) Vaginal odor> CV swab ? ?Orders Placed This Encounter  ?Procedures  ? POCT urine pregnancy  ? ? ?Follow-up: Return in about 3 months (around 09/11/2021) for Depo injection. ? ?Roma Schanz CNM, WHNP-BC ?06/11/2021 ?12:53 PM  ?

## 2021-06-12 LAB — CERVICOVAGINAL ANCILLARY ONLY
Bacterial Vaginitis (gardnerella): POSITIVE — AB
Candida Glabrata: NEGATIVE
Candida Vaginitis: NEGATIVE
Chlamydia: POSITIVE — AB
Comment: NEGATIVE
Comment: NEGATIVE
Comment: NEGATIVE
Comment: NEGATIVE
Comment: NEGATIVE
Comment: NORMAL
Neisseria Gonorrhea: NEGATIVE
Trichomonas: NEGATIVE

## 2021-06-12 MED ORDER — METRONIDAZOLE 500 MG PO TABS: 500.0000 mg | ORAL_TABLET | Freq: Two times a day (BID) | ORAL | 0 refills | Status: DC

## 2021-06-12 MED ORDER — DOXYCYCLINE HYCLATE 100 MG PO CAPS: 100.0000 mg | ORAL_CAPSULE | Freq: Two times a day (BID) | ORAL | 0 refills | Status: DC

## 2021-06-12 NOTE — Addendum Note (Signed)
Addended by: Cheral Marker on: 06/12/2021 01:35 PM ? ? Modules accepted: Orders ? ?

## 2021-09-03 ENCOUNTER — Other Ambulatory Visit (HOSPITAL_COMMUNITY)
Admission: RE | Admit: 2021-09-03 | Discharge: 2021-09-03 | Disposition: A | Discharge status: Home / Self Care | Payer: Medicaid Other | Source: Ambulatory Visit | Attending: Obstetrics & Gynecology | Admitting: Obstetrics & Gynecology

## 2021-09-03 ENCOUNTER — Ambulatory Visit (INDEPENDENT_AMBULATORY_CARE_PROVIDER_SITE_OTHER): Payer: Medicaid Other | Admitting: *Deleted

## 2021-09-03 DIAGNOSIS — N898 Other specified noninflammatory disorders of vagina: Secondary | ICD-10-CM | POA: Diagnosis not present

## 2021-09-03 DIAGNOSIS — Z3042 Encounter for surveillance of injectable contraceptive: Secondary | ICD-10-CM

## 2021-09-03 MED ORDER — MEDROXYPROGESTERONE ACETATE 150 MG/ML IM SUSP
150.0000 mg | Freq: Once | INTRAMUSCULAR | Status: AC
  Administered 2021-09-03: 150 mg via INTRAMUSCULAR

## 2021-09-03 NOTE — Progress Notes (Signed)
   NURSE VISIT- VAGINITIS/STD/POC  SUBJECTIVE:  Anne Lee is a 26 y.o. G2P1011 GYN patientfemale here for a vaginal swab for vaginitis screening, STD screen.  She reports the following symptoms:  vaginal discharge and odor  for several days. Denies abnormal vaginal bleeding, significant pelvic pain, fever, or UTI symptoms.  OBJECTIVE:  There were no vitals taken for this visit.  Appears well, in no apparent distress  ASSESSMENT: Vaginal swab for vaginitis screening & STD screening. Pt also received Depo today in left hip.  PLAN: Self-collected vaginal probe for Gonorrhea, Chlamydia, Trichomonas, Bacterial Vaginosis, Yeast sent to lab Treatment: to be determined once results are received Follow-up as needed if symptoms persist/worsen, or new symptoms develop. 12 weeks for next Depo.  Malachy Mood  09/03/2021 11:00 AM

## 2021-09-05 LAB — CERVICOVAGINAL ANCILLARY ONLY
Bacterial Vaginitis (gardnerella): POSITIVE — AB
Candida Glabrata: NEGATIVE
Candida Vaginitis: NEGATIVE
Chlamydia: NEGATIVE
Comment: NEGATIVE
Comment: NEGATIVE
Comment: NEGATIVE
Comment: NEGATIVE
Comment: NEGATIVE
Comment: NORMAL
Neisseria Gonorrhea: NEGATIVE
Trichomonas: NEGATIVE

## 2021-09-11 ENCOUNTER — Other Ambulatory Visit: Payer: Self-pay | Admitting: Obstetrics & Gynecology

## 2021-09-11 DIAGNOSIS — B9689 Other specified bacterial agents as the cause of diseases classified elsewhere: Secondary | ICD-10-CM

## 2021-09-11 MED ORDER — METRONIDAZOLE 0.75 % VA GEL: 1.0000 | Freq: Every day | VAGINAL | 0 refills | Status: AC

## 2021-11-19 ENCOUNTER — Ambulatory Visit (INDEPENDENT_AMBULATORY_CARE_PROVIDER_SITE_OTHER): Payer: Medicaid Other | Admitting: *Deleted

## 2021-11-19 DIAGNOSIS — Z3042 Encounter for surveillance of injectable contraceptive: Secondary | ICD-10-CM | POA: Diagnosis not present

## 2021-11-19 MED ORDER — MEDROXYPROGESTERONE ACETATE 150 MG/ML IM SUSP
150.0000 mg | Freq: Once | INTRAMUSCULAR | Status: AC
  Administered 2021-11-19: 150 mg via INTRAMUSCULAR

## 2021-11-19 NOTE — Progress Notes (Signed)
   NURSE VISIT- INJECTION  SUBJECTIVE:  Anne Lee is a 26 y.o. G86P1011 female here for a Depo Provera for contraception/period management. She is a GYN patient.   OBJECTIVE:  There were no vitals taken for this visit.  Appears well, in no apparent distress  Injection administered in: Right upper quad. gluteus  Meds ordered this encounter  Medications   medroxyPROGESTERone (DEPO-PROVERA) injection 150 mg    ASSESSMENT: GYN patient Depo Provera for contraception/period management PLAN: Follow-up: in 11-13 weeks for next Depo   Jobe Marker  11/19/2021 10:23 AM

## 2022-01-14 ENCOUNTER — Other Ambulatory Visit (INDEPENDENT_AMBULATORY_CARE_PROVIDER_SITE_OTHER): Payer: Medicaid Other | Admitting: *Deleted

## 2022-01-14 ENCOUNTER — Other Ambulatory Visit (HOSPITAL_COMMUNITY)
Admission: RE | Admit: 2022-01-14 | Discharge: 2022-01-14 | Disposition: A | Discharge status: Home / Self Care | Payer: Medicaid Other | Source: Ambulatory Visit | Attending: Obstetrics & Gynecology | Admitting: Obstetrics & Gynecology

## 2022-01-14 DIAGNOSIS — N898 Other specified noninflammatory disorders of vagina: Secondary | ICD-10-CM

## 2022-01-14 DIAGNOSIS — N899 Noninflammatory disorder of vagina, unspecified: Secondary | ICD-10-CM

## 2022-01-14 DIAGNOSIS — Z202 Contact with and (suspected) exposure to infections with a predominantly sexual mode of transmission: Secondary | ICD-10-CM | POA: Diagnosis not present

## 2022-01-14 NOTE — Progress Notes (Signed)
   NURSE VISIT- VAGINITIS/STD  SUBJECTIVE:  Anne Lee is a 26 y.o. G2P1011 GYN patientfemale here for a vaginal swab for vaginitis screening, STD screen.  She reports the following symptoms:  yellow vaginal discharge  for 2-3 days. Denies abnormal vaginal bleeding, significant pelvic pain, fever, or UTI symptoms.  OBJECTIVE:  There were no vitals taken for this visit.  Appears well, in no apparent distress  ASSESSMENT: Vaginal swab for vaginitis screening & STD screening.  PLAN: Self-collected vaginal probe for Gonorrhea, Chlamydia, Trichomonas, Bacterial Vaginosis, Yeast sent to lab Treatment: to be determined once results are received Follow-up as needed if symptoms persist/worsen, or new symptoms develop  Levy Pupa  01/14/2022 9:57 AM

## 2022-01-15 LAB — CERVICOVAGINAL ANCILLARY ONLY
Bacterial Vaginitis (gardnerella): POSITIVE — AB
Candida Glabrata: NEGATIVE
Candida Vaginitis: NEGATIVE
Chlamydia: NEGATIVE
Comment: NEGATIVE
Comment: NEGATIVE
Comment: NEGATIVE
Comment: NEGATIVE
Comment: NEGATIVE
Comment: NORMAL
Neisseria Gonorrhea: NEGATIVE
Trichomonas: NEGATIVE

## 2022-01-16 ENCOUNTER — Other Ambulatory Visit: Payer: Self-pay | Admitting: Adult Health

## 2022-01-16 MED ORDER — METRONIDAZOLE 500 MG PO TABS: 500.0000 mg | ORAL_TABLET | Freq: Two times a day (BID) | ORAL | 0 refills | Status: DC

## 2022-01-16 NOTE — Progress Notes (Signed)
+  BV on vaginal swab will rx flagyl 

## 2022-02-05 ENCOUNTER — Ambulatory Visit: Payer: Medicaid Other

## 2022-02-09 ENCOUNTER — Ambulatory Visit: Payer: Medicaid Other

## 2022-03-12 ENCOUNTER — Ambulatory Visit (INDEPENDENT_AMBULATORY_CARE_PROVIDER_SITE_OTHER): Payer: Medicaid Other | Admitting: Obstetrics & Gynecology

## 2022-03-12 ENCOUNTER — Other Ambulatory Visit (HOSPITAL_COMMUNITY)
Admission: RE | Admit: 2022-03-12 | Discharge: 2022-03-12 | Disposition: A | Discharge status: Home / Self Care | Payer: Medicaid Other | Source: Ambulatory Visit | Attending: Obstetrics & Gynecology | Admitting: Obstetrics & Gynecology

## 2022-03-12 ENCOUNTER — Other Ambulatory Visit (INDEPENDENT_AMBULATORY_CARE_PROVIDER_SITE_OTHER): Payer: Medicaid Other

## 2022-03-12 ENCOUNTER — Encounter: Payer: Self-pay | Admitting: Obstetrics & Gynecology

## 2022-03-12 VITALS — BP 109/71 | HR 86 | Ht 63.0 in | Wt 129.2 lb

## 2022-03-12 DIAGNOSIS — Z30017 Encounter for initial prescription of implantable subdermal contraceptive: Secondary | ICD-10-CM | POA: Diagnosis not present

## 2022-03-12 DIAGNOSIS — Z113 Encounter for screening for infections with a predominantly sexual mode of transmission: Secondary | ICD-10-CM | POA: Insufficient documentation

## 2022-03-12 MED ORDER — ETONOGESTREL 68 MG ~~LOC~~ IMPL
68.0000 mg | DRUG_IMPLANT | Freq: Once | SUBCUTANEOUS | Status: AC
  Administered 2022-03-12: 68 mg via SUBCUTANEOUS

## 2022-03-12 NOTE — Progress Notes (Signed)
   NEXPLANON INSERTION Patient name: Anne Lee MRN [id number]  Date of birth: [birth year] Subjective Findings:   Anne Lee is a 26 y.o. G43P1011 Caucasian female being seen today for insertion of a Nexplanon.  No LMP recorded. Patient has had an injection.  Pt has RN visit today and requested Nexplanon placement.  Unable to have IUD placed.  She has had device in the past and wishes to return to this form of contraception.  Risks/benefits/side effects of Nexplanon have been discussed and her questions have been answered.  Specifically, a failure rate of 04/998 has been reported, with an increased failure rate if pt takes St. John's Wort and/or antiseizure medicaitons.  She is aware of the common side effect of irregular bleeding, which the incidence of decreases over time. Signed copy of informed consent in chart.      10/08/2020   10:06 AM 10/04/2020   12:03 PM  Depression screen PHQ 2/9  Decreased Interest 1 1  Down, Depressed, Hopeless 1 1  PHQ - 2 Score 2 2  Altered sleeping 3 1  Tired, decreased energy 1 1  Change in appetite 1 0  Feeling bad or failure about yourself  1 0  Trouble concentrating 1 0  Moving slowly or fidgety/restless 1 1  Suicidal thoughts 1 0  PHQ-9 Score 11 5  Difficult doing work/chores Somewhat difficult         10/08/2020   10:06 AM 10/04/2020   12:03 PM  GAD 7 : Generalized Anxiety Score  Nervous, Anxious, on Edge 1 1  Control/stop worrying 1 1  Worry too much - different things 1 1  Trouble relaxing 1 1  Restless 1 1  Easily annoyed or irritable 1 1  Afraid - awful might happen 1 1  Total GAD 7 Score 7 7  Anxiety Difficulty Somewhat difficult      Pertinent History Reviewed:   Reviewed past medical,surgical, social, obstetrical and family history.  Reviewed problem list, medications and allergies. Objective Findings & Procedure:    Vitals:   03/12/22 1532  BP: 109/71  Pulse: 86  Weight: 129 lb 4 oz (58.6 kg)  Height: 5\' 3"  (1.6  m)  Body mass index is 22.9 kg/m.  No results found for this or any previous visit (from the past 24 hour(s)).   Time out was performed.  She is right-handed, so her left arm, approximately 10cm from the medial epicondyle and 3-5cm posterior to the sulcus, was cleansed with alcohol and anesthetized with 2cc of 2% Lidocaine.  The area was cleansed again with betadine and the Nexplanon was inserted per manufacturer's recommendations without difficulty.  3 steri-strips and pressure bandage were applied. The patient tolerated the procedure well.  Assessment & Plan:   1) Nexplanon insertion Pt was instructed to keep the area clean and dry, remove pressure bandage in 24 hours, and keep insertion site covered with the steri-strip for 3-5 days.  Back up contraception was recommended for 2 weeks.  She was given a card indicating date Nexplanon was inserted and date it needs to be removed. Follow-up PRN problems.  Follow-up: Return in about 1 year (around 03/13/2023) for Annual.  03/15/2023, DO Attending Obstetrician & Gynecologist, [facility], [facility]

## 2022-03-12 NOTE — Progress Notes (Signed)
   NURSE VISIT- VAGINITIS/STD/POC  SUBJECTIVE:  Anne Lee is a 26 y.o. G2P1011 GYN patientfemale here for a vaginal swab for STD screen.  She reports the following symptoms: none. Just wants routine screening.  Denies abnormal vaginal bleeding, significant pelvic pain, fever, or UTI symptoms.  OBJECTIVE:  BP 109/71 (BP Location: Right Arm, Patient Position: Sitting, Cuff Size: Normal)   Pulse 86   Ht 5\' 3"  (1.6 m)   Wt 129 lb 4 oz (58.6 kg)   BMI 22.90 kg/m   Appears well, in no apparent distress  ASSESSMENT: Vaginal swab for STD screen  PLAN: Self-collected vaginal probe for Gonorrhea, Chlamydia, Trichomonas, Bacterial Vaginosis, Yeast sent to lab Treatment: to be determined once results are received Follow-up as needed if symptoms persist/worsen, or new symptoms develop   03/12/2022 4:03 PM

## 2022-03-16 ENCOUNTER — Other Ambulatory Visit: Payer: Self-pay | Admitting: Adult Health

## 2022-03-16 ENCOUNTER — Encounter: Payer: Self-pay | Admitting: Women's Health

## 2022-03-16 DIAGNOSIS — A749 Chlamydial infection, unspecified: Secondary | ICD-10-CM

## 2022-03-16 LAB — CERVICOVAGINAL ANCILLARY ONLY
Bacterial Vaginitis (gardnerella): POSITIVE — AB
Candida Glabrata: NEGATIVE
Candida Vaginitis: NEGATIVE
Chlamydia: POSITIVE — AB
Comment: NEGATIVE
Comment: NEGATIVE
Comment: NEGATIVE
Comment: NEGATIVE
Comment: NEGATIVE
Comment: NORMAL
Neisseria Gonorrhea: NEGATIVE
Trichomonas: NEGATIVE

## 2022-03-16 MED ORDER — DOXYCYCLINE HYCLATE 100 MG PO TABS: 100.0000 mg | ORAL_TABLET | Freq: Two times a day (BID) | ORAL | 0 refills | Status: DC

## 2022-03-16 MED ORDER — METRONIDAZOLE 500 MG PO TABS: 500.0000 mg | ORAL_TABLET | Freq: Two times a day (BID) | ORAL | 0 refills | Status: DC

## 2022-04-14 ENCOUNTER — Encounter: Payer: Self-pay | Admitting: Obstetrics & Gynecology

## 2022-04-14 ENCOUNTER — Other Ambulatory Visit (HOSPITAL_COMMUNITY)
Admission: RE | Admit: 2022-04-14 | Discharge: 2022-04-14 | Disposition: A | Discharge status: Home / Self Care | Payer: Medicaid Other | Source: Ambulatory Visit | Attending: Obstetrics & Gynecology | Admitting: Obstetrics & Gynecology

## 2022-04-14 ENCOUNTER — Ambulatory Visit (INDEPENDENT_AMBULATORY_CARE_PROVIDER_SITE_OTHER): Payer: Medicaid Other | Admitting: Obstetrics & Gynecology

## 2022-04-14 VITALS — BP 115/71 | HR 74 | Ht 62.0 in | Wt 129.0 lb

## 2022-04-14 DIAGNOSIS — Z0000 Encounter for general adult medical examination without abnormal findings: Secondary | ICD-10-CM | POA: Diagnosis not present

## 2022-04-14 DIAGNOSIS — Z113 Encounter for screening for infections with a predominantly sexual mode of transmission: Secondary | ICD-10-CM

## 2022-04-14 NOTE — Progress Notes (Signed)
Subjective:     Anne Lee is a 27 y.o. female here for a routine exam.  No LMP recorded. Patient has had an implant. [id number] Birth Control Method:  nexplanon Menstrual Calendar(currently): amenorrheic on nexplanon  Current complaints: some spotting with intercourse.   Current acute medical issues:  none   Recent Gynecologic History No LMP recorded. Patient has had an implant. 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     Past Surgical History:  Procedure Laterality Date   CESAREAN SECTION     DILATION AND CURETTAGE OF UTERUS  2021   VAGINAL SEPTOPLASTY      OB History     Gravida  2   Para  1   Term  1   Preterm      AB  1   Living  1      SAB  1   IAB      Ectopic      Multiple      Live Births  1           Social History   Socioeconomic History   Marital status: Single    Spouse name: Not on file   Number of children: Not on file   Years of education: Not on file   Highest education level: Not on file  Occupational History   Not on file  Tobacco Use   Smoking status: Never   Smokeless tobacco: Never  Vaping Use   Vaping Use: Never used  Substance and Sexual Activity   Alcohol use: Never   Drug use: Never   Sexual activity: Yes    Birth control/protection: Implant  Other Topics Concern   Not on file  Social History Narrative   Not on file   Social Determinants of Health   Financial Resource Strain: Low Risk  (04/14/2022)   Overall Financial Resource Strain (CARDIA)    Difficulty of Paying Living Expenses: Not hard at all  Food Insecurity: No Food Insecurity (04/14/2022)   Hunger Vital Sign    Worried About Running Out of Food in the Last Year: Never true    Ran Out of Food in the Last Year: Never true  Transportation Needs: No Transportation Needs (04/14/2022)   PRAPARE - Administrator, Civil ServiceTransportation    Lack of Transportation (Medical): No    Lack of  Transportation (Non-Medical): No  Physical Activity: Sufficiently Active (04/14/2022)   Exercise Vital Sign    Days of Exercise per Week: 7 days    Minutes of Exercise per Session: 100 min  Stress: Stress Concern Present (04/14/2022)   [employer] - Occupational Stress Questionnaire    Feeling of Stress : To some extent  Social Connections: Socially Isolated (04/14/2022)   Social Connection and Isolation Panel [NHANES]    Frequency of Communication with Friends and Family: Once a week    Frequency of Social Gatherings with Friends and Family: Once a week    Attends Religious Services: Never    Database administratorActive Member of Clubs or Organizations: No    Attends BankerClub or Organization Meetings: Never    Marital Status: Separated    Family History  Problem Relation Age of Onset   Hypertension Father    Depression Mother    Anxiety disorder Mother    Kidney disease Mother    Hypertension Mother    Diabetes Mother  Club foot Son      Current Outpatient Medications:    etonogestrel (NEXPLANON) 98 MG IMPL implant, 1 each by Subdermal route once., Disp: , Rfl:   Review of Systems  Review of Systems  Constitutional: Negative for fever, chills, weight loss, malaise/fatigue and diaphoresis.  HENT: Negative for hearing loss, ear pain, nosebleeds, congestion, sore throat, neck pain, tinnitus and ear discharge.   Eyes: Negative for blurred vision, double vision, photophobia, pain, discharge and redness.  Respiratory: Negative for cough, hemoptysis, sputum production, shortness of breath, wheezing and stridor.   Cardiovascular: Negative for chest pain, palpitations, orthopnea, claudication, leg swelling and PND.  Gastrointestinal: negative for abdominal pain. Negative for heartburn, nausea, vomiting, diarrhea, constipation, blood in stool and melena.  Genitourinary: Negative for dysuria, urgency, frequency, hematuria and flank pain.  Musculoskeletal: Negative for myalgias, back  pain, joint pain and falls.  Skin: Negative for itching and rash.  Neurological: Negative for dizziness, tingling, tremors, sensory change, speech change, focal weakness, seizures, loss of consciousness, weakness and headaches.  Endo/Heme/Allergies: Negative for environmental allergies and polydipsia. Does not bruise/bleed easily.  Psychiatric/Behavioral: Negative for depression, suicidal ideas, hallucinations, memory loss and substance abuse. The patient is not nervous/anxious and does not have insomnia.        Objective:  Blood pressure 115/71, pulse 74, height 5\' 2"  (1.575 m), weight 129 lb (58.5 kg).   Physical Exam  Vitals reviewed. Constitutional: She is oriented to person, place, and time. She appears well-developed and well-nourished.  HENT:  Head: Normocephalic and atraumatic.        Right Ear: External ear normal.  Left Ear: External ear normal.  Nose: Nose normal.  Mouth/Throat: Oropharynx is clear and moist.  Eyes: Conjunctivae and EOM are normal. Pupils are equal, round, and reactive to light. Right eye exhibits no discharge. Left eye exhibits no discharge. No scleral icterus.  Neck: Normal range of motion. Neck supple. No tracheal deviation present. No thyromegaly present.  Cardiovascular: Normal rate, regular rhythm, normal heart sounds and intact distal pulses.  Exam reveals no gallop and no friction rub.   No murmur heard. Respiratory: Effort normal and breath sounds normal. No respiratory distress. She has no wheezes. She has no rales. She exhibits no tenderness.  GI: Soft. Bowel sounds are normal. She exhibits no distension and no mass. There is no tenderness. There is no rebound and no guarding.  Genitourinary:  Breasts no masses skin changes or nipple changes bilaterally      Vulva is normal without lesions Vagina is pink moist without discharge Cervix normal in appearance and pap is not done Uterus is normal size shape and contour Adnexa is negative with normal  sized ovaries   Musculoskeletal: Normal range of motion. She exhibits no edema and no tenderness.  Neurological: She is alert and oriented to person, place, and time. She has normal reflexes. She displays normal reflexes. No cranial nerve deficit. She exhibits normal muscle tone. Coordination normal.  Skin: Skin is warm and dry. No rash noted. No erythema. No pallor.  Psychiatric: She has a normal mood and affect. Her behavior is normal. Judgment and thought content normal.       Medications Ordered at today's visit: No orders of the defined types were placed in this encounter.   Other orders placed at today's visit: No orders of the defined types were placed in this encounter.     Assessment:    Normal Gyn exam.    Plan:    Contraception: Nexplanon.  STI swab screen per request today      Return in about 1 year (around 04/15/2023) for yearly.

## 2022-04-15 LAB — CERVICOVAGINAL ANCILLARY ONLY
Chlamydia: NEGATIVE
Comment: NEGATIVE
Comment: NEGATIVE
Comment: NORMAL
Neisseria Gonorrhea: NEGATIVE
Trichomonas: NEGATIVE

## 2022-05-20 ENCOUNTER — Ambulatory Visit: Payer: Medicaid Other | Admitting: Adult Health

## 2022-05-20 ENCOUNTER — Encounter: Payer: Self-pay | Admitting: Adult Health

## 2022-05-20 VITALS — BP 110/74 | HR 77 | Ht 62.0 in | Wt 131.5 lb

## 2022-05-20 DIAGNOSIS — Z3046 Encounter for surveillance of implantable subdermal contraceptive: Secondary | ICD-10-CM | POA: Diagnosis not present

## 2022-05-20 DIAGNOSIS — Z319 Encounter for procreative management, unspecified: Secondary | ICD-10-CM | POA: Insufficient documentation

## 2022-05-20 NOTE — Patient Instructions (Signed)
keep clean and dry x 24 hours, no heavy lifting, keep steri strips on x 72 hours, Keep pressure dressing on x 24 hours. Follow up prn problems.  Take PNV

## 2022-05-20 NOTE — Progress Notes (Signed)
  Subjective:     Patient ID: Anne Lee, female   DOB: March 24, 1996, 27 y.o.   MRN: [id number]  HPI Anne Lee is a 27 year old white female,single, G2P1011, in for nexplanon removal, wants to get pregnant.  Last pap was negative HPV and NILM 01/08/21. PCP is O. Queen Blossom NP  Review of Systems For nexplanon removal Reviewed past medical,surgical, social and family history. Reviewed medications and allergies.     Objective:   Physical Exam BP 110/74 (BP Location: Left Arm, Patient Position: Sitting, Cuff Size: Normal)   Pulse 77   Ht 5\' 2"  (1.575 m)   Wt 131 lb 8 oz (59.6 kg)   LMP 05/19/2022   BMI 24.05 kg/m  Consent signed and time out called. Left arm cleansed with betadine, and injected with 1.5 cc 2% lidocaine and waited til numb.Under sterile technique a #11 blade was used to make small vertical incision, and a curved forceps was used to easily remove rod. Steri strips applied. Pressure dressing applied.      Upstream - 05/20/22 1110       Pregnancy Intention Screening   Does the patient want to become pregnant in the next year? Yes    Does the patient's partner want to become pregnant in the next year? Yes    Would the patient like to discuss contraceptive options today? No      Contraception Wrap Up   Current Method Hormonal Implant    End Method Pregnant/Seeking Pregnancy             Assessment:     1. Nexplanon removal - keep clean and dry x 24 hours, no heavy lifting, keep steri strips on x 72 hours, Keep pressure dressing on x 24 hours. Follow up prn problems.   2. Patient desires pregnancy Take OTC PNV    Discussed could get pregnant first unprotected sex or could take 6-18 months of active trying   Plan:     Follow up prn

## 2022-06-25 ENCOUNTER — Encounter: Payer: Self-pay | Admitting: Adult Health

## 2022-06-25 ENCOUNTER — Ambulatory Visit: Payer: Medicaid Other | Admitting: Adult Health

## 2022-06-25 VITALS — BP 112/75 | HR 75 | Ht 62.0 in | Wt 130.5 lb

## 2022-06-25 DIAGNOSIS — Z319 Encounter for procreative management, unspecified: Secondary | ICD-10-CM | POA: Diagnosis not present

## 2022-06-25 NOTE — Progress Notes (Signed)
  Subjective:     Patient ID: Anne Lee, female   DOB: 25-Jan-1996, 27 y.o.   MRN: [id number]  HPI Anne Lee is a 27 year old white female, with SO, G2P1011 in to discuss getting pregnant, she had nexplanon removed 05/20/22. Last period was 06/13/22 and lasted 9 days.  Last pap was negative HPV,NILM 01/08/21  PCP is [facility]   Review of Systems Period lasted 9 days  Reviewed past medical,surgical, social and family history. Reviewed medications and allergies.     Objective:   Physical Exam BP 112/75 (BP Location: Left Arm, Patient Position: Sitting, Cuff Size: Normal)   Pulse 75   Ht 5\' 2"  (1.575 m)   Wt 130 lb 8 oz (59.2 kg)   LMP 06/13/2022   BMI 23.87 kg/m  Skin warm and dry.Lungs: clear to ausculation bilaterally. Cardiovascular: regular rate and rhythm.    Fall risk is low  Upstream - 06/25/22 0923       Pregnancy Intention Screening   Does the patient want to become pregnant in the next year? Yes    Does the patient's partner want to become pregnant in the next year? Yes    Would the patient like to discuss contraceptive options today? No      Contraception Wrap Up   Current Method Pregnant/Seeking Pregnancy    End Method Pregnant/Seeking Pregnancy             Assessment:     1. Patient desires pregnancy Take OTC PNV Discussed timing of sex Will check progesterone level 07/03/22  Discussed can take 6-18 months of active trying Also discussed clomid if not ovulating     Plan:     Follow up prn

## 2022-07-04 LAB — PROGESTERONE: Progesterone: 0.5 ng/mL

## 2022-07-06 ENCOUNTER — Other Ambulatory Visit: Payer: Self-pay | Admitting: Adult Health

## 2022-07-06 MED ORDER — CLOMIPHENE CITRATE 50 MG PO TABS: ORAL_TABLET | ORAL | 2 refills | Status: DC

## 2022-07-06 NOTE — Progress Notes (Signed)
Rx clomid 

## 2022-07-28 ENCOUNTER — Other Ambulatory Visit (INDEPENDENT_AMBULATORY_CARE_PROVIDER_SITE_OTHER): Payer: Medicaid Other

## 2022-07-28 ENCOUNTER — Other Ambulatory Visit (HOSPITAL_COMMUNITY)
Admission: RE | Admit: 2022-07-28 | Discharge: 2022-07-28 | Disposition: A | Discharge status: Home / Self Care | Payer: Medicaid Other | Source: Ambulatory Visit | Attending: Obstetrics & Gynecology | Admitting: Obstetrics & Gynecology

## 2022-07-28 DIAGNOSIS — R300 Dysuria: Secondary | ICD-10-CM | POA: Insufficient documentation

## 2022-07-28 LAB — POCT URINALYSIS DIPSTICK OB
Blood, UA: NEGATIVE
Glucose, UA: NEGATIVE
Ketones, UA: NEGATIVE
Leukocytes, UA: NEGATIVE
Nitrite, UA: NEGATIVE
POC,PROTEIN,UA: NEGATIVE

## 2022-07-28 NOTE — Progress Notes (Signed)
   NURSE VISIT- VAGINITIS/STD  SUBJECTIVE:  Anne Lee is a 27 y.o. G2P1011 GYN patientfemale here for a vaginal swab for vaginitis screening, STD screen.  She reports the following symptoms: burning with urination for 1 day. Denies abnormal vaginal bleeding, significant pelvic pain, fever.  OBJECTIVE:  There were no vitals taken for this visit.  Appears well, in no apparent distress  ASSESSMENT: Vaginal swab for vaginitis screening/STD screen Urine dip negative  PLAN: Self-collected vaginal probe for Gonorrhea, Chlamydia, Trichomonas, Bacterial Vaginosis, Yeast sent to lab Treatment: to be determined once results are received Follow-up as needed if symptoms persist/worsen, or new symptoms develop  Jobe Marker  07/28/2022 11:37 AM

## 2022-07-29 ENCOUNTER — Other Ambulatory Visit: Payer: Self-pay | Admitting: Adult Health

## 2022-07-29 LAB — CERVICOVAGINAL ANCILLARY ONLY
Bacterial Vaginitis (gardnerella): POSITIVE — AB
Candida Glabrata: NEGATIVE
Candida Vaginitis: NEGATIVE
Chlamydia: NEGATIVE
Comment: NEGATIVE
Comment: NEGATIVE
Comment: NEGATIVE
Comment: NEGATIVE
Comment: NEGATIVE
Comment: NORMAL
Neisseria Gonorrhea: NEGATIVE
Trichomonas: NEGATIVE

## 2022-07-29 MED ORDER — METRONIDAZOLE 500 MG PO TABS: 500.0000 mg | ORAL_TABLET | Freq: Two times a day (BID) | ORAL | 0 refills | Status: DC

## 2022-07-29 NOTE — Progress Notes (Signed)
+  BV will rx flagyl 

## 2022-08-26 ENCOUNTER — Ambulatory Visit: Payer: Medicaid Other | Admitting: Adult Health

## 2022-08-26 ENCOUNTER — Encounter: Payer: Self-pay | Admitting: Adult Health

## 2022-08-26 VITALS — BP 107/68 | HR 82 | Ht 62.0 in | Wt 126.5 lb

## 2022-08-26 DIAGNOSIS — R1031 Right lower quadrant pain: Secondary | ICD-10-CM

## 2022-08-26 DIAGNOSIS — N939 Abnormal uterine and vaginal bleeding, unspecified: Secondary | ICD-10-CM | POA: Diagnosis not present

## 2022-08-26 DIAGNOSIS — Z3202 Encounter for pregnancy test, result negative: Secondary | ICD-10-CM | POA: Diagnosis not present

## 2022-08-26 LAB — POCT URINE PREGNANCY: Preg Test, Ur: NEGATIVE

## 2022-08-26 NOTE — Progress Notes (Signed)
  Subjective:     Patient ID: Anne Lee, female   DOB: [birth year], 27 y.o.   MRN: [id number]  HPI Anne Lee is a 27 year old white female with SO, G2P1011, in complaining of bleeding, can be heavy at times, and pain yesterday RLQ that radiated to back and she was shaking, is better now. Had period 08/10/22 and started again 08/23/22, it is more brown now.      Component Value Date/Time   DIAGPAP  01/08/2021 1208    - Negative for Intraepithelial Lesions or Malignancy (NILM)   DIAGPAP - Benign reactive/reparative changes 01/08/2021 1208   HPVHIGH Negative 01/08/2021 1208   ADEQPAP  01/08/2021 1208    Satisfactory for evaluation; transformation zone component PRESENT.    Review of Systems +bleeding  +RLQ pain radiated to back Reviewed past medical,surgical, social and family history. Reviewed medications and allergies.     Objective:   Physical Exam BP 107/68 (BP Location: Left Arm, Patient Position: Sitting, Cuff Size: Normal)   Pulse 82   Ht 5\' 2"  (1.575 m)   Wt 126 lb 8 oz (57.4 kg)   LMP 08/10/2022 Comment: then again 08/23/22  BMI 23.14 kg/m  UPT is negative  Skin warm and dry.Pelvic: external genitalia is normal in appearance no lesions, vagina: +blood without odor,urethra has no lesions or masses noted, cervix:smooth and bulbous,no CMT, uterus: normal size, shape and contour, non tender, no masses felt, adnexa: no masses, RLQ tenderness noted. Bladder is non tender and no masses felt.       Fall risk is low  Upstream - 08/26/22 0907       Pregnancy Intention Screening   Does the patient want to become pregnant in the next year? Ok Either Way    Does the patient's partner want to become pregnant in the next year? Ok Either Way    Would the patient like to discuss contraceptive options today? No      Contraception Wrap Up   Current Method No Method - Other Reason    Reason for No Current Contraceptive Method at Intake ([facility] Only) Other    End Method No Method - Other  Reason               Examination chaperoned by Malachy Mood LPN  Impression: 1. Pregnancy examination or test, negative result - POCT urine pregnancy  2. Abnormal uterine bleeding (AUB) Had period 08/10/22 then started bleeding 08/23/22, heavy at times, slowing down now Will get [place] 08/28/22 at 1:30 pm at [facility] to assess uterus and ovaries  - US PELVIC COMPLETE WITH TRANSVAGINAL; Future  3. RLQ abdominal pain Had pain yesterday RLQ radiated to back and was shaking, better now Can take tylenol or advil for pain and push fluids  Will get [place] 08/28/22 at 1:30 pm at [facility] to assess uterus and ovaries  - US PELVIC COMPLETE WITH TRANSVAGINAL; Future    Will talk when results back, but she is aware could be cyst  Plan:     Follow up prn

## 2022-08-28 ENCOUNTER — Ambulatory Visit (HOSPITAL_COMMUNITY)
Admission: RE | Admit: 2022-08-28 | Discharge: 2022-08-28 | Disposition: A | Discharge status: Home / Self Care | Payer: Medicaid Other | Source: Ambulatory Visit | Attending: Adult Health | Admitting: Adult Health

## 2022-08-28 DIAGNOSIS — R1031 Right lower quadrant pain: Secondary | ICD-10-CM | POA: Diagnosis present

## 2022-08-28 DIAGNOSIS — N939 Abnormal uterine and vaginal bleeding, unspecified: Secondary | ICD-10-CM | POA: Diagnosis present

## 2022-11-17 ENCOUNTER — Encounter: Payer: Self-pay | Admitting: Obstetrics and Gynecology

## 2022-11-17 ENCOUNTER — Ambulatory Visit: Payer: Medicaid Other | Admitting: Obstetrics and Gynecology

## 2022-11-17 VITALS — BP 107/73 | HR 69 | Ht 62.0 in | Wt 134.0 lb

## 2022-11-17 DIAGNOSIS — Z3169 Encounter for other general counseling and advice on procreation: Secondary | ICD-10-CM

## 2022-11-17 NOTE — Progress Notes (Signed)
   RETURN GYNECOLOGY VISIT  Subjective:  Anne Lee is a 27 y.o. G2P1011 with LMP 11/08/22 presenting for fertility discussion  Pt has been following with [facility]. Has hx uterine didelphys. Had Nexplanon removed 05/2022 and has been trying to conceive x 3 months by timing intercourse with ovulation. Using Flow app. Has regular cycles. Has not tried OPKs. Is not taking PNV. Had a non-ovulatory cycle based on D21 progesterone in March (<1 month after Nexplanon removal)  I personally reviewed the following: - 07/03/22 D21 progesterone 0.5 - 08/26/22 UPT negative - 08/28/22 pelvic US - uterine didelphys w/ R uterus 8.3 x 4.2 x 4.1cm & L uterus 7.9 x 4.1 x 3.9cm, normal EL x2, normal ovaries w/ small 2cm cyst  Objective:   Vitals:   11/17/22 0921  BP: 107/73  Pulse: 69  Weight: 134 lb (60.8 kg)  Height: 5\' 2"  (1.575 m)    General:  Alert, oriented and cooperative. Patient is in no acute distress.  Skin: Skin is warm and dry. No rash noted.   Cardiovascular: Normal heart rate noted  Respiratory: Normal respiratory effort, no problems with respiration noted  Abdomen: Soft, non-tender, non-distended    Assessment and Plan:  Anne Lee is a 27 y.o. with uterine didelphys, desire for pregnancy  Encounter for preconception consultation - Reassurance provided. Discussed that 80% of couples with conceive within 1 year of timed intercourse, but that the likelihood of conceiving with each cycle is only around 30% - Reviewed that I would not recommend additional testing right now. Her cycles are regular. Reviewed that online kits that test for fertility are not necessarily accurate and test for responsiveness to assisted reproductive technology, NOT ability to conceive spontaneously - Discussed timed intercourse with OPKs. If she does not have +OPKs with the next 1-2 cycles, she should reach out for further discussion - Start PNV - Pt expressed her understanding & was agreeable w/  plan  For further details of counseling, please see AVS.   I spent an total of 28 minutes interviewing/counseling the patient, reviewing her chart and documenting our encounter.   Lennart Pall, MD

## 2022-11-17 NOTE — Patient Instructions (Signed)
The best days to get pregnant are the 1-2 days before you ovulate (release an egg) and the day you ovulate.   You can predict the day of ovulation in 4 ways: Tracking with a calendar. This works best if you have extremely regular menstrual cycles. Tracking changes to cervical mucus. You are most likely to conceive when cervical mucus is clear and slippery. Tracking your body temperature. This is less helpful because your body temperature doesn't rise until AFTER you ovulated (too late to time intercourse) It is ideal to use a special basal body temperature thermometer since it can pick up subtle differences. You take your temperature first thing in the morning - before you get out of bed, use the bathroom, or eat/drink anything. You will need to chart your temperature every day to be able to pick up on a meaningful rise. Tracking with ovulation predictor kits. These are urine tests that you do at home. They pick up a hormone in your urine called "LH" which spikes right before you ovulate. You should time intercourse to the day of the [facility] spike. You want to start testing in the 5 days before you think you'll ovulate, but the kits usually come with instructions that explain when to use them.  If you are trying to get pregnant, take a prenatal vitamin every day. They work best to help your baby's brain & spinal cord growth if you're taking them BEFORE you get pregnant.

## 2022-11-17 NOTE — Progress Notes (Signed)
Pt would like to discuss becoming pregnant. Pt wants to make sure she isn't "infertile".  Pt states that she has been trying the last 3 months- has hx of Nexplanon(removed Feb 2024) and depo.   Pt has hx of 1 sab and 1 delivery by c/s.    Pt states she will have spotting several days before starting cycle. Pt states this has happened the last two months.

## 2022-11-28 ENCOUNTER — Encounter: Payer: Self-pay | Admitting: Obstetrics and Gynecology

## 2023-01-13 ENCOUNTER — Encounter: Payer: Self-pay | Admitting: Obstetrics and Gynecology

## 2023-01-17 ENCOUNTER — Emergency Department (HOSPITAL_COMMUNITY): Payer: Medicaid Other

## 2023-01-17 ENCOUNTER — Encounter (HOSPITAL_COMMUNITY): Payer: Self-pay | Admitting: Emergency Medicine

## 2023-01-17 ENCOUNTER — Other Ambulatory Visit: Payer: Self-pay

## 2023-01-17 ENCOUNTER — Emergency Department (HOSPITAL_COMMUNITY)
Admission: EM | Admit: 2023-01-17 | Discharge: 2023-01-17 | Disposition: A | Discharge status: Home / Self Care | Payer: Medicaid Other | Attending: Emergency Medicine | Admitting: Emergency Medicine

## 2023-01-17 DIAGNOSIS — R1032 Left lower quadrant pain: Secondary | ICD-10-CM | POA: Diagnosis present

## 2023-01-17 DIAGNOSIS — Z91040 Latex allergy status: Secondary | ICD-10-CM | POA: Diagnosis not present

## 2023-01-17 LAB — COMPREHENSIVE METABOLIC PANEL
ALT: 12 U/L (ref 0–44)
AST: 18 U/L (ref 15–41)
Albumin: 3.6 g/dL (ref 3.5–5.0)
Alkaline Phosphatase: 52 U/L (ref 38–126)
Anion gap: 9 (ref 5–15)
BUN: <5 mg/dL — ABNORMAL LOW (ref 6–20)
CO2: 24 mmol/L (ref 22–32)
Calcium: 8.6 mg/dL — ABNORMAL LOW (ref 8.9–10.3)
Chloride: 105 mmol/L (ref 98–111)
Creatinine, Ser: 0.8 mg/dL (ref 0.44–1.00)
GFR, Estimated: >60 mL/min (ref >60)
Glucose, Bld: 96 mg/dL (ref 70–99)
Potassium: 3.7 mmol/L (ref 3.5–5.1)
Sodium: 138 mmol/L (ref 135–145)
Total Bilirubin: 0.6 mg/dL (ref 0.3–1.2)
Total Protein: 6.7 g/dL (ref 6.5–8.1)

## 2023-01-17 LAB — URINALYSIS, W/ REFLEX TO CULTURE (INFECTION SUSPECTED)
Bacteria, UA: NONE SEEN
Bilirubin Urine: NEGATIVE
Glucose, UA: NEGATIVE mg/dL
Hgb urine dipstick: NEGATIVE
Ketones, ur: NEGATIVE mg/dL
Nitrite: NEGATIVE
Protein, ur: NEGATIVE mg/dL
Specific Gravity, Urine: 1.005 (ref 1.005–1.030)
pH: 8 (ref 5.0–8.0)

## 2023-01-17 LAB — CBC WITH DIFFERENTIAL/PLATELET
Abs Immature Granulocytes: 0.01 10*3/uL (ref 0.00–0.07)
Basophils Absolute: 0 10*3/uL (ref 0.0–0.1)
Basophils Relative: 1 %
Eosinophils Absolute: 0.1 10*3/uL (ref 0.0–0.5)
Eosinophils Relative: 2 %
HCT: 39 % (ref 36.0–46.0)
Hemoglobin: 12.7 g/dL (ref 12.0–15.0)
Immature Granulocytes: 0 %
Lymphocytes Relative: 46 %
Lymphs Abs: 2.8 10*3/uL (ref 0.7–4.0)
MCH: 29.7 pg (ref 26.0–34.0)
MCHC: 32.6 g/dL (ref 30.0–36.0)
MCV: 91.3 fL (ref 80.0–100.0)
Monocytes Absolute: 0.5 10*3/uL (ref 0.1–1.0)
Monocytes Relative: 8 %
Neutro Abs: 2.5 10*3/uL (ref 1.7–7.7)
Neutrophils Relative %: 43 %
Platelets: 184 10*3/uL (ref 150–400)
RBC: 4.27 MIL/uL (ref 3.87–5.11)
RDW: 12.8 % (ref 11.5–15.5)
WBC: 5.9 10*3/uL (ref 4.0–10.5)
nRBC: 0 % (ref 0.0–0.2)

## 2023-01-17 LAB — HCG, SERUM, QUALITATIVE: Preg, Serum: NEGATIVE

## 2023-01-17 MED ORDER — KETOROLAC TROMETHAMINE 15 MG/ML IJ SOLN
30.0000 mg | Freq: Once | INTRAMUSCULAR | Status: AC
  Administered 2023-01-17: 30 mg via INTRAMUSCULAR
  Filled 2023-01-17: qty 2

## 2023-01-17 MED ORDER — HYDROCODONE-ACETAMINOPHEN 5-325 MG PO TABS
1.0000 | ORAL_TABLET | Freq: Once | ORAL | Status: AC
  Administered 2023-01-17: 1 via ORAL
  Filled 2023-01-17: qty 1

## 2023-01-17 NOTE — Discharge Instructions (Signed)
Evaluation for your lower abdominal pain was overall reassuring.  Do recommend you follow-up with your OB/GYN provider.  In the meantime you can treat it like a muscle injury.  Recommend icing the area of pain 3-4 times a day for the next 72 hours.  Also he can take ibuprofen and Tylenol as needed.

## 2023-01-17 NOTE — ED Provider Notes (Signed)
[place] EMERGENCY DEPARTMENT AT [facility] Provider Note   CSN: [id number] Arrival date & time: 01/17/23  1253     History  Chief Complaint  Patient presents with   lower abd pain   Back Pain    CLAUDEAN LEAVELLE is a 27 y.o. female, G2P1011, who presents to the ED secondary to severe left lower quadrant abdominal pain, that came on when she was having sex with her partner.  She states she was having sex with her partner, and she suddenly had severe left lower quadrant pain radiating to her back, that was sharp and stabbing.  She did not have any nausea, vomiting, denies any fevers or chills.  No urinary complaints.  States this all began around 12:00 today.  Denies any vaginal discharge, or trauma.  Has not been having any vaginal bleeding, last menstrual period was 01/05/2023.  States the pain is an 8 out of 10.  Has not taken anything for the pain.     Home Medications Prior to Admission medications   Not on File      Allergies    Amoxicillin and Latex    Review of Systems   Review of Systems  Gastrointestinal:  Positive for abdominal pain. Negative for nausea and vomiting.  Musculoskeletal:  Positive for back pain.    Physical Exam Updated Vital Signs BP 106/70   Pulse 70   Temp 98.3 F (36.8 C)   Resp 16   Ht 5\' 2"  (1.575 m)   Wt 60.8 kg   SpO2 98%   BMI 24.52 kg/m  Physical Exam Vitals and nursing note reviewed.  Constitutional:      General: She is not in acute distress.    Appearance: She is well-developed.  HENT:     Head: Normocephalic and atraumatic.  Eyes:     Conjunctiva/sclera: Conjunctivae normal.  Cardiovascular:     Rate and Rhythm: Normal rate and regular rhythm.     Heart sounds: No murmur heard. Pulmonary:     Effort: Pulmonary effort is normal. No respiratory distress.     Breath sounds: Normal breath sounds.  Abdominal:     Palpations: Abdomen is soft.     Tenderness: There is abdominal tenderness in the left lower  quadrant. There is no guarding.  Musculoskeletal:        General: No swelling.     Cervical back: Neck supple.  Skin:    General: Skin is warm and dry.     Capillary Refill: Capillary refill takes less than 2 seconds.  Neurological:     Mental Status: She is alert.  Psychiatric:        Mood and Affect: Mood normal.     ED Results / Procedures / Treatments   Labs (all labs ordered are listed, but only abnormal results are displayed) Labs Reviewed  COMPREHENSIVE METABOLIC PANEL - Abnormal; Notable for the following components:      Result Value   BUN <5 (*)    Calcium 8.6 (*)    All other components within normal limits  URINALYSIS, W/ REFLEX TO CULTURE (INFECTION SUSPECTED) - Abnormal; Notable for the following components:   Color, Urine STRAW (*)    Leukocytes,Ua TRACE (*)    All other components within normal limits  CBC WITH DIFFERENTIAL/PLATELET  HCG, SERUM, QUALITATIVE    EKG None  Radiology No results found.  Procedures Procedures    Medications Ordered in ED Medications  HYDROcodone-acetaminophen (NORCO/VICODIN) 5-325 MG per tablet 1  tablet (1 tablet Oral Given 01/17/23 1341)    ED Course/ Medical Decision Making/ A&P Clinical Course as of 01/17/23 1518  Sun Jan 17, 2023  1502 Having sex today, developed acute LLQ pain rad to back. Feels like cyst. Torsion study. If ok, can follow up with OB/Gyn. [JR]    Clinical Course User Index [JR] Gareth Eagle, PA-C                                 Medical Decision Making Patient is a 27 year old female, here for left lower quadrant pain, that came on about an hour prior to arrival.  She states that sharp and stabbing, going to her back.  States very similar to her last cyst.  We will obtain a transvaginal ultrasound, to rule out torsion, as well as as well as basic blood work.  She reports she is not pregnant, with her last menstrual period being September 24.  Amount and/or Complexity of Data Reviewed Labs:  ordered.    Details: Blood work unremarkable, urine Preg negative Radiology: ordered. Discussion of management or test interpretation with external provider(s): Patient is well-appearing overall, not writhing in distress, but does appear tender to the left lower quadrant.  No guarding.  Norco given, patient and transvaginal ultrasound, currently, to rule out torsion, handed off to [place], [place], to follow-up on patient's results.  If negative for torsion, likely discharge, with ibuprofen, heating pad, for supportive treatment for ovarian cyst.  Risk Prescription drug management.    Final Clinical Impression(s) / ED Diagnoses Final diagnoses:  LLQ pain    Rx / DC Orders ED Discharge Orders     None         Tommaso Cavitt, Harley [place], PA 01/17/23 1518    Gwyneth Sprout, MD 01/21/23 [phone]

## 2023-01-17 NOTE — ED Triage Notes (Signed)
Pt came in POV for left lower 8/10 abd pain radiating to left lower back. Onset was sudden  during and after sex.  Dx with 2 mm ovarian cyst in May.

## 2023-01-17 NOTE — ED Notes (Signed)
Pt transported to US

## 2023-01-17 NOTE — ED Provider Notes (Signed)
Accepted handoff at shift change from [facility]. Please see prior provider note for more detail.   Briefly: Patient is 27 y.o. presenting for acute left lower quadrant abdominal pain that occurred while having sex.  DDX: concern for ovarian torsion, ectopic pregnancy, MSK, intra-abdominal infection  Plan: Follow-up on ultrasound, if reassuring patient can be discharged with follow-up with OB/GYN    Physical Exam  BP 96/66 (BP Location: Left Arm)   Pulse 64   Temp 98.5 F (36.9 C) (Oral)   Resp 16   Ht 5\' 2"  (1.575 m)   Wt 60.8 kg   SpO2 100%   BMI 24.52 kg/m   Physical Exam  Procedures  Procedures  ED Course / MDM   Clinical Course as of 01/17/23 1729  Wynelle Link Jan 17, 2023  1502 Having sex today, developed acute LLQ pain rad to back. Feels like cyst. Torsion study. If ok, can follow up with OB/Gyn. [JR]    Clinical Course User Index [JR] Gareth Eagle, PA-C   Medical Decision Making Amount and/or Complexity of Data Reviewed Labs: ordered. Radiology: ordered.  Risk Prescription drug management.    No acute findings on ultrasound.  On reassessment patient stated that overall her pain improved but still present.  Treated with IM Toradol.  Advised her to follow-up with her OB/GYN provider.  Suspect MSK injury.  Advised supportive treatment at home.  Vital stable throughout encounter.  Discussed return precautions.  Discharged in good condition.      Gareth Eagle, PA-C 01/17/23 1732    Terald Sleeper, MD 01/17/23 [phone]

## 2023-02-01 ENCOUNTER — Ambulatory Visit: Payer: Medicaid Other

## 2023-02-02 ENCOUNTER — Other Ambulatory Visit: Payer: Medicaid Other

## 2023-02-02 DIAGNOSIS — Z3169 Encounter for other general counseling and advice on procreation: Secondary | ICD-10-CM

## 2023-02-08 LAB — FOLLICLE STIMULATING HORMONE: FSH: 6 m[IU]/mL

## 2023-02-08 LAB — ESTRADIOL: Estradiol: 22.7 pg/mL

## 2023-02-08 LAB — ANTI MULLERIAN HORMONE: ANTI-MULLERIAN HORMONE (AMH): 7.96 ng/mL

## 2023-02-22 ENCOUNTER — Other Ambulatory Visit: Payer: Medicaid Other

## 2023-02-22 DIAGNOSIS — Z3169 Encounter for other general counseling and advice on procreation: Secondary | ICD-10-CM

## 2023-02-23 ENCOUNTER — Encounter: Payer: Self-pay | Admitting: Obstetrics and Gynecology

## 2023-02-23 LAB — PROGESTERONE: Progesterone: 3.3 ng/mL

## 2023-03-02 ENCOUNTER — Ambulatory Visit: Payer: Medicaid Other | Admitting: Obstetrics & Gynecology

## 2023-03-02 ENCOUNTER — Encounter: Payer: Self-pay | Admitting: Obstetrics & Gynecology

## 2023-03-02 VITALS — BP 103/68 | HR 76 | Ht 62.0 in | Wt 135.0 lb

## 2023-03-02 DIAGNOSIS — Z319 Encounter for procreative management, unspecified: Secondary | ICD-10-CM | POA: Diagnosis not present

## 2023-03-02 DIAGNOSIS — Q5128 Other doubling of uterus, other specified: Secondary | ICD-10-CM | POA: Diagnosis not present

## 2023-03-02 DIAGNOSIS — N889 Noninflammatory disorder of cervix uteri, unspecified: Secondary | ICD-10-CM | POA: Diagnosis not present

## 2023-03-02 NOTE — Progress Notes (Signed)
Patient ID: Anne Lee, female   DOB: [birth year], 27 y.o.   MRN: [id number]  Chief Complaint  Patient presents with   Gynecologic Exam    HPI Anne Lee is a 27 y.o. female.  [id number] Patient's last menstrual period was 02/27/2023 (exact date). She is trying to conceive and her partner is to have a semen analysis in 3 days. He does not have any children. H/O uterine didelphis.   HPI  Past Medical History:  Diagnosis Date   Asthma    Uterus didelphys in pregnancy     Past Surgical History:  Procedure Laterality Date   CESAREAN SECTION     DILATION AND CURETTAGE OF UTERUS  2021   VAGINAL SEPTOPLASTY      Family History  Problem Relation Age of Onset   Hypertension Father    Depression Mother    Anxiety disorder Mother    Kidney disease Mother    Hypertension Mother    Diabetes Mother    Club foot Son     Social History Social History   Tobacco Use   Smoking status: Never   Smokeless tobacco: Never  Vaping Use   Vaping status: Never Used  Substance Use Topics   Alcohol use: Never   Drug use: Never    Allergies  Allergen Reactions   Amoxicillin Hives   Latex Other (See Comments)    Condoms-burning & UTI    No current outpatient medications on file.   No current facility-administered medications for this visit.    Review of Systems Review of Systems  Genitourinary: Negative.  Negative for menstrual problem, pelvic pain, vaginal bleeding and vaginal discharge.    Blood pressure 103/68, pulse 76, height 5\' 2"  (1.575 m), weight 135 lb (61.2 kg), last menstrual period 02/27/2023.  Physical Exam Physical Exam Vitals and nursing note reviewed.  Constitutional:      Appearance: Normal appearance.  HENT:     Head: Normocephalic and atraumatic.  Neurological:     General: No focal deficit present.     Mental Status: She is alert.  Psychiatric:        Mood and Affect: Mood normal.        Behavior: Behavior normal.     Data Reviewed U/S   results  Assessment Patient desires pregnancy  Abnormality of uterine cervix  Didelphic uterus Patient is ovulatory and there is not a high suspicion of tubal abnormality. Her partner needs to have a semen analysis  Plan Continue to attempt conception and if she needs HSG I recommend referral to infertility specialist because of didelphis     Scheryl Darter 03/02/2023, 4:56 PM

## 2023-03-02 NOTE — Progress Notes (Signed)
27 y.o. GYN presents for Conception issues.  Pt is trying to conceive, her partner will get a sperm count test this Friday, he has not fathered any children.

## 2023-04-15 ENCOUNTER — Ambulatory Visit: Payer: Medicaid Other | Admitting: Nurse Practitioner

## 2023-05-02 NOTE — Progress Notes (Deleted)
Established Patient Office Visit  Subjective  Patient ID: Anne Lee, female    DOB: May 26, 1995  Age: 28 y.o. MRN: [id number]  No chief complaint on file.   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   Past Medical History:  Diagnosis Date   Asthma    Uterus didelphys in pregnancy    Past Surgical History:  Procedure Laterality Date   CESAREAN SECTION     DILATION AND CURETTAGE OF UTERUS  2021   VAGINAL SEPTOPLASTY     Social History   Tobacco Use   Smoking status: Never   Smokeless tobacco: Never  Vaping Use   Vaping status: Never Used  Substance Use Topics   Alcohol use: Never   Drug use: Never   Social History   Socioeconomic History   Marital status: Significant Other    Spouse name: Not on file   Number of children: Not on file   Years of education: Not on file   Highest education level: Not on file  Occupational History   Not on file  Tobacco Use   Smoking status: Never   Smokeless tobacco: Never  Vaping Use   Vaping status: Never Used  Substance and Sexual Activity   Alcohol use: Never   Drug use: Never   Sexual activity: Yes    Birth control/protection: None  Other Topics Concern   Not on file  Social History Narrative   Not on file   Social Drivers of Health   Financial Resource Strain: Low Risk  (04/14/2022)   Overall Financial Resource Strain (CARDIA)    Difficulty of Paying Living Expenses: Not hard at all  Food Insecurity: No Food Insecurity (04/14/2022)   Hunger Vital Sign    Worried About Running Out of Food in the Last Year: Never true    Ran Out of Food in the Last Year: Never true  Transportation Needs: No Transportation Needs (04/14/2022)   PRAPARE  - Administrator, Civil Service (Medical): No    Lack of Transportation (Non-Medical): No  Physical Activity: Sufficiently Active (04/14/2022)   Exercise Vital Sign    Days of Exercise per Week: 7 days    Minutes of Exercise per Session: 100 min  Stress: Stress Concern Present (04/14/2022)   [employer] of Occupational Health - Occupational Stress Questionnaire    Feeling of Stress : To some extent  Social Connections: Socially Isolated (04/14/2022)   Social Connection and Isolation Panel [NHANES]    Frequency of Communication with Friends and Family: Once a week    Frequency of Social Gatherings with Friends and Family: Once a week    Attends Religious Services: Never    Database administrator or Organizations: No    Attends Banker Meetings: Never    Marital Status: Separated  Intimate Partner Violence: Not At Risk (04/14/2022)   Humiliation, Afraid, Rape, and Kick questionnaire    Fear of Current or Ex-Partner: No    Emotionally Abused: No    Physically Abused: No    Sexually Abused: No   Family Status  Relation Name Status   Father  Alive   Mother  Alive   Son  Alive  No partnership data  on file   Family History  Problem Relation Age of Onset   Hypertension Father    Depression Mother    Anxiety disorder Mother    Kidney disease Mother    Hypertension Mother    Diabetes Mother    Club foot Son    Allergies  Allergen Reactions   Amoxicillin Hives   Latex Other (See Comments)    Condoms-burning & UTI      ROS Negative unless indicated in HPI   Objective:     There were no vitals taken for this visit. BP Readings from Last 3 Encounters:  03/02/23 103/68  01/17/23 96/66  11/17/22 107/73   Wt Readings from Last 3 Encounters:  03/02/23 135 lb (61.2 kg)  01/17/23 134 lb 0.6 oz (60.8 kg)  11/17/22 134 lb (60.8 kg)      Physical Exam   No results found for any visits on 05/04/23.  Last CBC Lab Results  Component Value Date    WBC 5.9 01/17/2023   HGB 12.7 01/17/2023   HCT 39.0 01/17/2023   MCV 91.3 01/17/2023   MCH 29.7 01/17/2023   RDW 12.8 01/17/2023   PLT 184 01/17/2023   Last metabolic panel Lab Results  Component Value Date   GLUCOSE 96 01/17/2023   NA 138 01/17/2023   K 3.7 01/17/2023   CL 105 01/17/2023   CO2 24 01/17/2023   BUN <5 (L) 01/17/2023   CREATININE 0.80 01/17/2023   GFRNONAA >60 01/17/2023   CALCIUM 8.6 (L) 01/17/2023   PROT 6.7 01/17/2023   ALBUMIN 3.6 01/17/2023   BILITOT 0.6 01/17/2023   ALKPHOS 52 01/17/2023   AST 18 01/17/2023   ALT 12 01/17/2023   ANIONGAP 9 01/17/2023          Assessment & Plan:  There are no diagnoses linked to this encounter. Continue healthy lifestyle choices, including diet (rich in fruits, vegetables, and lean proteins, and low in salt and simple carbohydrates) and exercise (at least 30 minutes of moderate physical activity daily).     The above assessment and management plan was discussed with the patient. The patient verbalized understanding of and has agreed to the management plan. Patient is aware to call the clinic if they develop any new symptoms or if symptoms persist or worsen. Patient is aware when to return to the clinic for a follow-up visit. Patient educated on when it is appropriate to go to the emergency department.  No follow-ups on file.    Arrie Aran Santa Lighter, [facility] [address] [place], [place] [address] [phone]    Note: This document was prepared by [employer] voice dictation technology and any errors that results from this process are unintentional.

## 2023-05-04 ENCOUNTER — Ambulatory Visit: Payer: Medicaid Other | Admitting: Nurse Practitioner

## 2023-05-04 DIAGNOSIS — F418 Other specified anxiety disorders: Secondary | ICD-10-CM

## 2023-05-06 ENCOUNTER — Encounter: Payer: Self-pay | Admitting: Nurse Practitioner
